# Patient Record
Sex: Male | Born: 1960 | Race: Black or African American | Hispanic: Refuse to answer | Marital: Married | Smoking: Never smoker
Health system: Southern US, Community
[De-identification: ages and names within clinical notes are randomized; demographics above are authoritative.]

## PROBLEM LIST (undated history)

## (undated) DIAGNOSIS — I10 Essential (primary) hypertension: Secondary | ICD-10-CM

## (undated) DIAGNOSIS — C61 Malignant neoplasm of prostate: Secondary | ICD-10-CM

## (undated) DIAGNOSIS — R972 Elevated prostate specific antigen [PSA]: Secondary | ICD-10-CM

## (undated) HISTORY — PX: NO PAST SURGERIES: SHX2092

## (undated) HISTORY — PX: PROSTATE BIOPSY: SHX241

---

## 2005-02-10 ENCOUNTER — Emergency Department (HOSPITAL_COMMUNITY): Admission: EM | Admit: 2005-02-10 | Discharge: 2005-02-10 | Payer: Self-pay | Admitting: Emergency Medicine

## 2010-03-27 ENCOUNTER — Emergency Department (HOSPITAL_COMMUNITY): Admission: EM | Admit: 2010-03-27 | Discharge: 2010-03-27 | Payer: Self-pay | Admitting: Emergency Medicine

## 2010-09-09 LAB — URINALYSIS, ROUTINE W REFLEX MICROSCOPIC
Ketones, ur: NEGATIVE mg/dL
Nitrite: NEGATIVE
Protein, ur: NEGATIVE mg/dL
Specific Gravity, Urine: 1.017 (ref 1.005–1.030)
pH: 7 (ref 5.0–8.0)

## 2010-09-09 LAB — DIFFERENTIAL
Basophils Absolute: 0 10*3/uL (ref 0.0–0.1)
Eosinophils Relative: 1 % (ref 0–5)
Lymphocytes Relative: 44 % (ref 12–46)
Monocytes Relative: 7 % (ref 3–12)
Neutro Abs: 1.2 10*3/uL — ABNORMAL LOW (ref 1.7–7.7)

## 2010-09-09 LAB — CBC
Hemoglobin: 15.4 g/dL (ref 13.0–17.0)
MCHC: 34.5 g/dL (ref 30.0–36.0)
MCV: 85.5 fL (ref 78.0–100.0)
RBC: 5.23 MIL/uL (ref 4.22–5.81)

## 2010-09-09 LAB — BASIC METABOLIC PANEL
Creatinine, Ser: 1.43 mg/dL (ref 0.4–1.5)
GFR calc non Af Amer: 53 mL/min — ABNORMAL LOW (ref >60)
Glucose, Bld: 99 mg/dL (ref 70–99)
Potassium: 4.2 mEq/L (ref 3.5–5.1)
Sodium: 138 mEq/L (ref 135–145)

## 2013-04-02 ENCOUNTER — Emergency Department (HOSPITAL_COMMUNITY)
Admission: EM | Admit: 2013-04-02 | Discharge: 2013-04-02 | Disposition: A | Discharge status: Home / Self Care | Payer: Self-pay | Attending: Emergency Medicine | Admitting: Emergency Medicine

## 2013-04-02 ENCOUNTER — Encounter (HOSPITAL_COMMUNITY): Payer: Self-pay | Admitting: Emergency Medicine

## 2013-04-02 DIAGNOSIS — N189 Chronic kidney disease, unspecified: Secondary | ICD-10-CM | POA: Insufficient documentation

## 2013-04-02 DIAGNOSIS — I129 Hypertensive chronic kidney disease with stage 1 through stage 4 chronic kidney disease, or unspecified chronic kidney disease: Secondary | ICD-10-CM | POA: Insufficient documentation

## 2013-04-02 DIAGNOSIS — I10 Essential (primary) hypertension: Secondary | ICD-10-CM

## 2013-04-02 DIAGNOSIS — R209 Unspecified disturbances of skin sensation: Secondary | ICD-10-CM | POA: Insufficient documentation

## 2013-04-02 DIAGNOSIS — E876 Hypokalemia: Secondary | ICD-10-CM | POA: Insufficient documentation

## 2013-04-02 DIAGNOSIS — R202 Paresthesia of skin: Secondary | ICD-10-CM

## 2013-04-02 DIAGNOSIS — Z79899 Other long term (current) drug therapy: Secondary | ICD-10-CM | POA: Insufficient documentation

## 2013-04-02 HISTORY — DX: Essential (primary) hypertension: I10

## 2013-04-02 LAB — BASIC METABOLIC PANEL
BUN: 27 mg/dL — ABNORMAL HIGH (ref 6–23)
CO2: 32 mEq/L (ref 19–32)
Chloride: 97 mEq/L (ref 96–112)
Sodium: 139 mEq/L (ref 135–145)

## 2013-04-02 MED ORDER — NIFEDIPINE ER 60 MG PO TB24: 60.0000 mg | ORAL_TABLET | Freq: Every day | ORAL | Status: DC

## 2013-04-02 MED ORDER — CHLORTHALIDONE 25 MG PO TABS: 25.0000 mg | ORAL_TABLET | Freq: Every day | ORAL | Status: DC

## 2013-04-02 MED ORDER — METOPROLOL SUCCINATE ER 100 MG PO TB24: 100.0000 mg | ORAL_TABLET | Freq: Every day | ORAL | Status: DC

## 2013-04-02 MED ORDER — POTASSIUM CHLORIDE 20 MEQ/15ML (10%) PO LIQD
40.0000 meq | Freq: Once | ORAL | Status: AC
  Administered 2013-04-02: 40 meq via ORAL
  Filled 2013-04-02: qty 30

## 2013-04-02 MED ORDER — CHLORTHALIDONE 25 MG PO TABS
25.0000 mg | ORAL_TABLET | Freq: Once | ORAL | Status: AC
  Administered 2013-04-02: 25 mg via ORAL
  Filled 2013-04-02: qty 1

## 2013-04-02 NOTE — ED Provider Notes (Signed)
CSN: [id number]     Arrival date & time 04/02/13  0603 History   First MD Initiated Contact with Patient 04/02/13 [phone]     Chief Complaint  Patient presents with  . Hypertension   (Consider location/radiation/quality/duration/timing/severity/associated sxs/prior Treatment) HPI  This is a 52 year old male who presents with hypertension. The patient states "I can just feel my blood pressure was going up."  Patient states he's been out of his blood pressure medications for one day. He was only able to get one refill. He is unsure which one that is but states that it was a small round pill. He did take that medication this morning. He is on metoprolol, chlorthalidone, and nifedipine. Patient denies any headache or chest pain. Patient does state that this morning he noted tingling in his right fifth digit. He denies any weakness or numbness.  Patient denies any other symptoms at this time.  Past Medical History  Diagnosis Date  . Hypertension    History reviewed. No pertinent past surgical history. History reviewed. No pertinent family history. History  Substance Use Topics  . Smoking status: Never Smoker   . Smokeless tobacco: Not on file  . Alcohol Use: Not on file    Review of Systems  Constitutional: Negative.  Negative for fever.  Respiratory: Negative.  Negative for chest tightness and shortness of breath.   Cardiovascular: Negative.  Negative for chest pain.  Gastrointestinal: Negative.  Negative for abdominal pain.  Genitourinary: Negative.  Negative for dysuria.  Musculoskeletal: Negative for back pain.  Neurological: Negative for headaches.       Right fifth digit tingling  All other systems reviewed and are negative.    Allergies  Review of patient's allergies indicates not on file.  Home Medications   Current Outpatient Rx  Name  Route  Sig  Dispense  Refill  . chlorthalidone (HYGROTON) 25 MG tablet   Oral   Take 25 mg by mouth daily.         . metoprolol  succinate (TOPROL-XL) 100 MG 24 hr tablet   Oral   Take 100 mg by mouth at bedtime. Take with or immediately following a meal.         . NIFEdipine (PROCARDIA XL/ADALAT-CC) 60 MG 24 hr tablet   Oral   Take 60 mg by mouth daily at 12 noon. Takes at lunch time.         . chlorthalidone (HYGROTON) 25 MG tablet   Oral   Take 1 tablet (25 mg total) by mouth daily.   30 tablet   0   . metoprolol succinate (TOPROL-XL) 100 MG 24 hr tablet   Oral   Take 1 tablet (100 mg total) by mouth at bedtime.   30 tablet   0   . NIFEdipine (PROCARDIA-XL/ADALAT CC) 60 MG 24 hr tablet   Oral   Take 1 tablet (60 mg total) by mouth daily.   30 tablet   0    BP 157/104  Pulse 71  Temp(Src) 98.2 F (36.8 C) (Oral)  Resp 18  Ht 5\' 11"  (1.803 m)  Wt 217 lb (98.431 kg)  BMI 30.28 kg/m2  SpO2 99% Physical Exam  Nursing note and vitals reviewed. Constitutional: He is oriented to person, place, and time. He appears well-developed and well-nourished. No distress.  HENT:  Head: Normocephalic and atraumatic.  Mouth/Throat: Oropharynx is clear and moist.  Eyes: EOM are normal. Pupils are equal, round, and reactive to light.  Neck: Neck supple.  Cardiovascular:  Normal rate, regular rhythm and normal heart sounds.   No murmur heard. Pulmonary/Chest: Effort normal and breath sounds normal. No respiratory distress. He has no wheezes.  Abdominal: Soft. Bowel sounds are normal. There is no tenderness.  Musculoskeletal: He exhibits no edema.  Lymphadenopathy:    He has no cervical adenopathy.  Neurological: He is alert and oriented to person, place, and time.  Cranial nerves II through XII intact, 5 out of 5 strength in all 4 extremities, no evidence of dysmetria on finger-nose-finger  Skin: Skin is warm and dry.  Psychiatric: He has a normal mood and affect.    ED Course  Procedures (including critical care time) Labs Review Labs Reviewed  BASIC METABOLIC PANEL - Abnormal; Notable for the  following:    Potassium 3.0 (*)    Glucose, Bld 104 (*)    BUN 27 (*)    Creatinine, Ser 1.69 (*)    GFR calc non Af Amer 45 (*)    GFR calc Af Amer 52 (*)    All other components within normal limits   Imaging Review No results found.  EKG independently reviewed by myself: Normal sinus rhythm with a rate of 67, Q waves noted in lead 3 and the anterior leads which is unchanged from prior. New T wave inversion in lead III.  no evidence of acute ST elevation.  MDM   1. Hypertension   2. Paresthesia   3. Chronic kidney disease   4. Hypokalemia    This a 52 year old male who presents with hypertension and paresthesias of the right upper extremity. Patient was noted to be hypertensive on initial evaluation. He denies any chest pain or headache. Screening EKG was obtained it does not show any evidence of acute MI. BMP is notable for a creatinine of 1.69 which is slightly elevated from the patient's baseline of 1.4. He was also noted to be hypokalemic. Potassium was replaced. I researched the pills that he may have taken this morning and I suspect he took his metoprolol given the description of thepill. Patient was given his chlorthalidone.  Regarding the patient's right pinky tingling, his neurologic exam is otherwise within normal limits and I have low suspicion for intracranial abnormality or stroke.  The patient is to followup with his primary care physician for repeat lab work and blood pressure monitoring.  After history, exam, and medical workup I feel the patient has been appropriately medically screened and is safe for discharge home. Pertinent diagnoses were discussed with the patient. Patient was given return precautions.     Shon Baton, MD 04/02/13 [phone]

## 2013-04-02 NOTE — ED Notes (Addendum)
Per pt: I figured it (blood pressure) was going up and then my finger started tingling (right pinky finger).

## 2016-09-06 ENCOUNTER — Emergency Department (HOSPITAL_COMMUNITY): Payer: BLUE CROSS/BLUE SHIELD

## 2016-09-06 ENCOUNTER — Encounter (HOSPITAL_COMMUNITY): Payer: Self-pay | Admitting: Emergency Medicine

## 2016-09-06 ENCOUNTER — Observation Stay (HOSPITAL_COMMUNITY)
Admission: EM | Admit: 2016-09-06 | Discharge: 2016-09-07 | Disposition: A | Discharge status: Home / Self Care | Payer: BLUE CROSS/BLUE SHIELD | Attending: Neurosurgery | Admitting: Neurosurgery

## 2016-09-06 DIAGNOSIS — Z79899 Other long term (current) drug therapy: Secondary | ICD-10-CM | POA: Insufficient documentation

## 2016-09-06 DIAGNOSIS — S066XAA Traumatic subarachnoid hemorrhage with loss of consciousness status unknown, initial encounter: Secondary | ICD-10-CM | POA: Diagnosis present

## 2016-09-06 DIAGNOSIS — S0101XA Laceration without foreign body of scalp, initial encounter: Secondary | ICD-10-CM | POA: Insufficient documentation

## 2016-09-06 DIAGNOSIS — W2211XA Striking against or struck by driver side automobile airbag, initial encounter: Secondary | ICD-10-CM | POA: Diagnosis not present

## 2016-09-06 DIAGNOSIS — Z23 Encounter for immunization: Secondary | ICD-10-CM | POA: Insufficient documentation

## 2016-09-06 DIAGNOSIS — I10 Essential (primary) hypertension: Secondary | ICD-10-CM | POA: Insufficient documentation

## 2016-09-06 DIAGNOSIS — S066X0A Traumatic subarachnoid hemorrhage without loss of consciousness, initial encounter: Principal | ICD-10-CM | POA: Insufficient documentation

## 2016-09-06 DIAGNOSIS — S066X9A Traumatic subarachnoid hemorrhage with loss of consciousness of unspecified duration, initial encounter: Secondary | ICD-10-CM

## 2016-09-06 DIAGNOSIS — Y92410 Unspecified street and highway as the place of occurrence of the external cause: Secondary | ICD-10-CM | POA: Insufficient documentation

## 2016-09-06 LAB — CBC
HEMATOCRIT: 46.2 % (ref 39.0–52.0)
HEMOGLOBIN: 15.7 g/dL (ref 13.0–17.0)
MCH: 29.2 pg (ref 26.0–34.0)
MCHC: 34 g/dL (ref 30.0–36.0)
MCV: 85.9 fL (ref 78.0–100.0)
Platelets: 147 10*3/uL — ABNORMAL LOW (ref 150–400)
RBC: 5.38 MIL/uL (ref 4.22–5.81)
RDW: 13.4 % (ref 11.5–15.5)
WBC: 11.2 10*3/uL — ABNORMAL HIGH (ref 4.0–10.5)

## 2016-09-06 LAB — COMPREHENSIVE METABOLIC PANEL
ALT: 28 U/L (ref 17–63)
ANION GAP: 11 (ref 5–15)
AST: 44 U/L — ABNORMAL HIGH (ref 15–41)
Albumin: 4.3 g/dL (ref 3.5–5.0)
Alkaline Phosphatase: 67 U/L (ref 38–126)
BUN: 19 mg/dL (ref 6–20)
CHLORIDE: 101 mmol/L (ref 101–111)
CO2: 27 mmol/L (ref 22–32)
Calcium: 8.9 mg/dL (ref 8.9–10.3)
Creatinine, Ser: 1.43 mg/dL — ABNORMAL HIGH (ref 0.61–1.24)
GFR calc non Af Amer: 53 mL/min — ABNORMAL LOW (ref >60)
Glucose, Bld: 190 mg/dL — ABNORMAL HIGH (ref 65–99)
Potassium: 3.2 mmol/L — ABNORMAL LOW (ref 3.5–5.1)
SODIUM: 139 mmol/L (ref 135–145)
Total Bilirubin: 0.9 mg/dL (ref 0.3–1.2)
Total Protein: 7 g/dL (ref 6.5–8.1)

## 2016-09-06 LAB — PROTIME-INR
INR: 0.97
Prothrombin Time: 12.9 seconds (ref 11.4–15.2)

## 2016-09-06 LAB — ETHANOL: Alcohol, Ethyl (B): <5 mg/dL (ref <5)

## 2016-09-06 LAB — HIV ANTIBODY (ROUTINE TESTING W REFLEX): HIV SCREEN 4TH GENERATION: NONREACTIVE

## 2016-09-06 LAB — CDS SEROLOGY

## 2016-09-06 MED ORDER — NIFEDIPINE ER 60 MG PO TB24
60.0000 mg | ORAL_TABLET | Freq: Every day | ORAL | Status: DC
  Administered 2016-09-06 – 2016-09-07 (×2): 60 mg via ORAL
  Filled 2016-09-06 (×2): qty 1

## 2016-09-06 MED ORDER — SODIUM CHLORIDE 0.9 % IV SOLN
INTRAVENOUS | Status: DC
  Administered 2016-09-06: 11:00:00 via INTRAVENOUS
  Filled 2016-09-06 (×2): qty 1000

## 2016-09-06 MED ORDER — HYDROCODONE-ACETAMINOPHEN 5-325 MG PO TABS
1.0000 | ORAL_TABLET | Freq: Once | ORAL | Status: AC
  Administered 2016-09-06: 1 via ORAL
  Filled 2016-09-06: qty 1

## 2016-09-06 MED ORDER — LABETALOL HCL 5 MG/ML IV SOLN
10.0000 mg | Freq: Once | INTRAVENOUS | Status: AC
  Administered 2016-09-06: 10 mg via INTRAVENOUS
  Filled 2016-09-06: qty 4

## 2016-09-06 MED ORDER — METOPROLOL SUCCINATE ER 100 MG PO TB24
100.0000 mg | ORAL_TABLET | Freq: Every day | ORAL | Status: DC
  Administered 2016-09-06: 100 mg via ORAL
  Filled 2016-09-06: qty 1

## 2016-09-06 MED ORDER — CHLORTHALIDONE 25 MG PO TABS
25.0000 mg | ORAL_TABLET | Freq: Every day | ORAL | Status: DC
  Administered 2016-09-06 – 2016-09-07 (×2): 25 mg via ORAL
  Filled 2016-09-06 (×2): qty 1

## 2016-09-06 MED ORDER — HYDROCODONE-ACETAMINOPHEN 5-325 MG PO TABS: 1.0000 | ORAL_TABLET | ORAL | Status: DC | PRN

## 2016-09-06 MED ORDER — ONDANSETRON 4 MG PO TBDP
8.0000 mg | ORAL_TABLET | Freq: Once | ORAL | Status: AC
  Administered 2016-09-06: 8 mg via ORAL
  Filled 2016-09-06: qty 2

## 2016-09-06 MED ORDER — ONDANSETRON HCL 4 MG PO TABS: 4.0000 mg | ORAL_TABLET | Freq: Four times a day (QID) | ORAL | Status: DC | PRN

## 2016-09-06 MED ORDER — HYDROCODONE-ACETAMINOPHEN 5-325 MG PO TABS
2.0000 | ORAL_TABLET | ORAL | Status: DC | PRN
  Administered 2016-09-06: 2 via ORAL
  Filled 2016-09-06 (×2): qty 2

## 2016-09-06 MED ORDER — TETANUS-DIPHTH-ACELL PERTUSSIS 5-2.5-18.5 LF-MCG/0.5 IM SUSP
0.5000 mL | Freq: Once | INTRAMUSCULAR | Status: AC
  Administered 2016-09-06: 0.5 mL via INTRAMUSCULAR
  Filled 2016-09-06: qty 0.5

## 2016-09-06 MED ORDER — ONDANSETRON HCL 4 MG/2ML IJ SOLN
4.0000 mg | Freq: Four times a day (QID) | INTRAMUSCULAR | Status: DC | PRN
  Filled 2016-09-06: qty 2

## 2016-09-06 MED ORDER — ACETAMINOPHEN 325 MG PO TABS: 650.0000 mg | ORAL_TABLET | ORAL | Status: DC | PRN
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
## 2016-09-06 NOTE — ED Notes (Signed)
Dr. Wickline at bedside.  
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
## 2016-09-06 NOTE — Care Management Note (Signed)
Case Management Note  Patient Details  Name: Bryce Sullivan MRN: [id number] Date of Birth: 02-Mar-1961  Subjective/Objective:      [facility]  Work-up underway. CM will follow for discharge needs pending patient's progress and physician orders.               Action/Plan:   Expected Discharge Date:                  Expected Discharge Plan:     In-House Referral:     Discharge planning Services     Post Acute Care Choice:    Choice offered to:     DME Arranged:    DME Agency:     HH Arranged:    HH Agency:     Status of Service:     If discussed at [employer] Length of Stay Meetings, dates discussed:    Additional Comments:  Anda KraftRobarge,  C, RN 09/06/2016, 10:24 AM
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
## 2016-09-07 ENCOUNTER — Encounter (HOSPITAL_COMMUNITY): Payer: Self-pay

## 2016-09-07 ENCOUNTER — Observation Stay (HOSPITAL_COMMUNITY): Payer: BLUE CROSS/BLUE SHIELD

## 2016-09-07 DIAGNOSIS — S066X0A Traumatic subarachnoid hemorrhage without loss of consciousness, initial encounter: Secondary | ICD-10-CM | POA: Diagnosis not present
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
## 2016-09-07 NOTE — Progress Notes (Signed)
D/C instructions provided to patient, denies questions/concerns at this time. Patient taken to front entrance via W/C by volunteer services.
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
## 2016-09-07 NOTE — Care Management Note (Signed)
Case Management Note  Patient Details  Name: Bryce Sullivan MRN: [id number] Date of Birth: 27-Oct-1960  Subjective/Objective:                    Action/Plan: Pt discharging home with self care. Pt with insurance and transportation home. Pt with hospital f/u. No further needs per CM.  Expected Discharge Date:  09/07/16               Expected Discharge Plan:  Home/Self Care  In-House Referral:     Discharge planning Services     Post Acute Care Choice:    Choice offered to:     DME Arranged:    DME Agency:     HH Arranged:    HH Agency:     Status of Service:  Completed, signed off  If discussed at [employer] Length of Stay Meetings, dates discussed:    Additional Comments:  Kermit BaloKelli F , RN 09/07/2016, 10:49 AM
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
## 2020-02-26 ENCOUNTER — Encounter (HOSPITAL_COMMUNITY): Payer: Self-pay | Admitting: Emergency Medicine

## 2020-02-26 ENCOUNTER — Emergency Department (HOSPITAL_COMMUNITY)
Admission: EM | Admit: 2020-02-26 | Discharge: 2020-02-26 | Disposition: A | Discharge status: Home / Self Care | Payer: BC Managed Care – PPO | Attending: Emergency Medicine | Admitting: Emergency Medicine

## 2020-02-26 ENCOUNTER — Other Ambulatory Visit: Payer: Self-pay

## 2020-02-26 DIAGNOSIS — I10 Essential (primary) hypertension: Secondary | ICD-10-CM | POA: Diagnosis present

## 2020-02-26 DIAGNOSIS — Z79899 Other long term (current) drug therapy: Secondary | ICD-10-CM | POA: Insufficient documentation

## 2020-02-26 MED ORDER — HYDROCHLOROTHIAZIDE 12.5 MG PO CAPS
12.5000 mg | ORAL_CAPSULE | Freq: Once | ORAL | Status: AC
  Administered 2020-02-26: 09:00:00 12.5 mg via ORAL
  Filled 2020-02-26: qty 1

## 2020-02-26 MED ORDER — AMLODIPINE BESYLATE 10 MG PO TABS: 10.0000 mg | ORAL_TABLET | Freq: Every day | ORAL | 0 refills | Status: DC

## 2020-02-26 MED ORDER — HYDROCHLOROTHIAZIDE 12.5 MG PO TABS: 12.5000 mg | ORAL_TABLET | Freq: Every day | ORAL | 0 refills | Status: DC

## 2020-02-26 MED ORDER — AMLODIPINE BESYLATE 5 MG PO TABS
ORAL_TABLET | ORAL | Status: AC
  Filled 2020-02-26: qty 1

## 2020-02-26 MED ORDER — AMLODIPINE BESYLATE 5 MG PO TABS
10.0000 mg | ORAL_TABLET | Freq: Once | ORAL | Status: AC
Start: 2020-02-26 — End: 2020-02-26
  Administered 2020-02-26: 09:00:00 10 mg via ORAL
  Filled 2020-02-26: qty 2

## 2020-02-26 NOTE — ED Notes (Signed)
NORVASC 10mg  is 2 tablets.  This RN accidentally dropped one of the tablets in the trash.  A replacement tablet was pulled and given.

## 2020-02-26 NOTE — ED Triage Notes (Signed)
Pt reports that he thought his doctor was out for money so quit seeing him over year ago-so hasnt had HTN meds that long. Reports that today woke up with nose bleed and BP was 200s/100s. Nose bleed has stopped. Pt BP today here 242/144.

## 2020-02-26 NOTE — ED Notes (Signed)
Verbalized understanding discharge instructions, prescriptions, and follow-up. In no acute distress.   

## 2020-02-26 NOTE — ED Provider Notes (Signed)
[facility]   CSN: [id number] Arrival date & time: 02/26/20  1540     History Chief Complaint  Patient presents with  . Hypertension    Bryce Sullivan is a 59 y.o. male.  The history is provided by the patient.  Hypertension This is a chronic problem. The current episode started more than 1 week ago. The problem occurs daily. The problem has not changed since onset.Pertinent negatives include no chest pain, no abdominal pain, no headaches and no shortness of breath. Nothing aggravates the symptoms. Nothing relieves the symptoms. He has tried nothing for the symptoms. The treatment provided no relief.       Past Medical History:  Diagnosis Date  . Hypertension     Patient Active Problem List   Diagnosis Date Noted  . Traumatic subarachnoid hemorrhage (HCC) 09/06/2016    History reviewed. No pertinent surgical history.     No family history on file.  Social History   Tobacco Use  . Smoking status: Never Smoker  . Smokeless tobacco: Never Used  Substance Use Topics  . Alcohol use: Not on file  . Drug use: No    Home Medications Prior to Admission medications   Medication Sig Start Date End Date Taking? Authorizing Provider  amLODipine (NORVASC) 10 MG tablet Take 1 tablet (10 mg total) by mouth daily. 02/26/20 03/27/20  , , DO  hydrochlorothiazide (HYDRODIURIL) 12.5 MG tablet Take 1 tablet (12.5 mg total) by mouth daily. 02/26/20 03/27/20  , , DO  chlorthalidone (HYGROTON) 25 MG tablet Take 1 tablet (25 mg total) by mouth daily. 04/02/13 02/26/20  Horton, Mayer Masker, MD  metoprolol succinate (TOPROL-XL) 100 MG 24 hr tablet Take 1 tablet (100 mg total) by mouth at bedtime. 04/02/13 02/26/20  Horton, Mayer Masker, MD  NIFEdipine (PROCARDIA-XL/ADALAT CC) 60 MG 24 hr tablet Take 1 tablet (60 mg total) by mouth daily. 04/02/13 02/26/20  Horton, Mayer Masker, MD    Allergies    Patient has no known allergies.  Review  of Systems   Review of Systems  Constitutional: Negative for chills and fever.  HENT: Negative for ear pain and sore throat.   Eyes: Negative for pain and visual disturbance.  Respiratory: Negative for cough and shortness of breath.   Cardiovascular: Negative for chest pain and palpitations.  Gastrointestinal: Negative for abdominal pain and vomiting.  Genitourinary: Negative for dysuria and hematuria.  Musculoskeletal: Negative for arthralgias and back pain.  Skin: Negative for color change and rash.  Neurological: Negative for seizures, syncope and headaches.  All other systems reviewed and are negative.   Physical Exam Updated Vital Signs BP (!) 209/149   Pulse 87   Temp 98.4 F (36.9 C) (Oral)   Resp 17   SpO2 98%   Physical Exam Vitals and nursing note reviewed.  Constitutional:      General: He is not in acute distress.    Appearance: He is well-developed. He is not ill-appearing.  HENT:     Head: Normocephalic and atraumatic.     Nose: Nose normal.     Mouth/Throat:     Mouth: Mucous membranes are moist.  Eyes:     Extraocular Movements: Extraocular movements intact.     Conjunctiva/sclera: Conjunctivae normal.     Pupils: Pupils are equal, round, and reactive to light.  Cardiovascular:     Rate and Rhythm: Normal rate and regular rhythm.     Pulses: Normal pulses.     Heart sounds:  Normal heart sounds. No murmur heard.   Pulmonary:     Effort: Pulmonary effort is normal. No respiratory distress.     Breath sounds: Normal breath sounds.  Abdominal:     Palpations: Abdomen is soft.     Tenderness: There is no abdominal tenderness.  Musculoskeletal:        General: Normal range of motion.     Cervical back: Normal range of motion and neck supple.  Skin:    General: Skin is warm and dry.     Capillary Refill: Capillary refill takes less than 2 seconds.  Neurological:     General: No focal deficit present.     Mental Status: He is alert and oriented to  person, place, and time.     Cranial Nerves: No cranial nerve deficit.     Sensory: No sensory deficit.     Motor: No weakness.     Coordination: Coordination normal.  Psychiatric:        Mood and Affect: Mood normal.     ED Results / Procedures / Treatments   Labs (all labs ordered are listed, but only abnormal results are displayed) Labs Reviewed - No data to display  EKG None  Radiology No results found.  Procedures Procedures (including critical care time)  Medications Ordered in ED Medications  amLODipine (NORVASC) 5 MG tablet (has no administration in time range)  amLODipine (NORVASC) tablet 10 mg (10 mg Oral Given 02/26/20 0922)  hydrochlorothiazide (MICROZIDE) capsule 12.5 mg (12.5 mg Oral Given 02/26/20 4967)    ED Course  I have reviewed the triage vital signs and the nursing notes.  Pertinent labs & imaging results that were available during my care of the patient were reviewed by me and considered in my medical decision making (see chart for details).    MDM Rules/Calculators/A&P                          Chanc Kervin is a 59 year old male with history of hypertension who presents the ED with hypertension.  Blood pressure initially in the 240s over 140 systolic on repeat 209/140.  Patient has not taken blood pressure medication in over a year.  He is asymptomatic.  Does not follow with primary care anymore.  Was not happy with his care as blood pressure was always in the 200s.  Denies any chest pain.  Normal neurological exam.  Overall he is asymptomatic.  On chart review he does have CKD at baseline.  Did offer him lab work today but he declined.  We will start him on amlodipine and hydrochlorothiazide.  He states that he has a new doctor to follow-up with in [place].  He understands return precautions and was discharged in ED in good condition.  This chart was dictated using voice recognition software.  Despite best efforts to proofread,  errors can occur which can  change the documentation meaning.    Final Clinical Impression(s) / ED Diagnoses Final diagnoses:  Hypertension, unspecified type    Rx / DC Orders ED Discharge Orders         Ordered    amLODipine (NORVASC) 10 MG tablet  Daily        02/26/20 0925    hydrochlorothiazide (HYDRODIURIL) 12.5 MG tablet  Daily        02/26/20 0925           Virgina Norfolk, DO 02/26/20 [phone]

## 2020-02-27 ENCOUNTER — Ambulatory Visit: Payer: BC Managed Care – PPO

## 2020-02-27 ENCOUNTER — Ambulatory Visit: Payer: BC Managed Care – PPO | Attending: Internal Medicine | Admitting: Internal Medicine

## 2020-02-27 ENCOUNTER — Encounter: Payer: Self-pay | Admitting: Internal Medicine

## 2020-02-27 DIAGNOSIS — I10 Essential (primary) hypertension: Secondary | ICD-10-CM | POA: Insufficient documentation

## 2020-02-27 DIAGNOSIS — R040 Epistaxis: Secondary | ICD-10-CM | POA: Insufficient documentation

## 2020-02-27 MED ORDER — AMLODIPINE BESYLATE 10 MG PO TABS: 10.0000 mg | ORAL_TABLET | Freq: Every day | ORAL | 2 refills | Status: DC

## 2020-02-27 MED ORDER — HYDROCHLOROTHIAZIDE 12.5 MG PO TABS: 12.5000 mg | ORAL_TABLET | Freq: Every day | ORAL | 2 refills | Status: DC

## 2020-02-27 MED ORDER — CARVEDILOL 3.125 MG PO TABS: 3.1250 mg | ORAL_TABLET | Freq: Two times a day (BID) | ORAL | 2 refills | Status: DC

## 2020-02-27 NOTE — Progress Notes (Signed)
Virtual Visit via Telephone Note Due to current restrictions/limitations of in-office visits due to the COVID-19 pandemic, this scheduled clinical appointment was converted to a telehealth visit  I connected with Bryce Sullivan on 02/27/20 at  9:50 AM EDT by telephone and verified that I am speaking with the correct person using two identifiers.  I am in my office.  The patient is at home.  Only the patient, his fiance and myself participated in this encounter.  I discussed the limitations, risks, security and privacy concerns of performing an evaluation and management service by telephone and the availability of in person appointments. I also discussed with the patient that there may be a patient responsible charge related to this service. The patient expressed understanding and agreed to proceed.   History of Present Illness: Pt with hx of HTN.  Pt est care and for ER f/u.  Had PCP in [facility] but he stopped going because his BP stayed elevated despite being on med.  Last seen there 6-7 mths ago.  He was Chlorthalidone.  Pt went to ER yesterday due to RT sided epistasis. He felt this was due to BP being elevated.  BP on arrival was 240/140.  Patient started on amlodipine and hydrochlorothiazide. -BP this a.m 196/119 at home -he limits salt in foods.  No CP/SOB/LE edema.  No HA. No further epistaxis but had 3 episodes this yr always from RT nostril.  Does not pick nose.    Past medical, surgical, family history and social history reviewed and updated in the system.  Outpatient Encounter Medications as of 02/27/2020  Medication Sig  . amLODipine (NORVASC) 10 MG tablet Take 1 tablet (10 mg total) by mouth daily.  . carvedilol (COREG) 3.125 MG tablet Take 1 tablet (3.125 mg total) by mouth 2 (two) times daily with a meal.  . hydrochlorothiazide (HYDRODIURIL) 12.5 MG tablet Take 1 tablet (12.5 mg total) by mouth daily.  . [DISCONTINUED] amLODipine (NORVASC) 10 MG tablet Take 1 tablet (10 mg total)  by mouth daily.  . [DISCONTINUED] chlorthalidone (HYGROTON) 25 MG tablet Take 1 tablet (25 mg total) by mouth daily.  . [DISCONTINUED] hydrochlorothiazide (HYDRODIURIL) 12.5 MG tablet Take 1 tablet (12.5 mg total) by mouth daily.  . [DISCONTINUED] metoprolol succinate (TOPROL-XL) 100 MG 24 hr tablet Take 1 tablet (100 mg total) by mouth at bedtime.  . [DISCONTINUED] NIFEdipine (PROCARDIA-XL/ADALAT CC) 60 MG 24 hr tablet Take 1 tablet (60 mg total) by mouth daily.   No facility-administered encounter medications on file as of 02/27/2020.   Social History   Socioeconomic History  . Marital status: Significant Other    Spouse name: Not on file  . Number of children: 3  . Years of education: Not on file  . Highest education level: 12th grade  Occupational History  . Not on file  Tobacco Use  . Smoking status: Never Smoker  . Smokeless tobacco: Never Used  Vaping Use  . Vaping Use: Never used  Substance and Sexual Activity  . Alcohol use: Not on file    Comment: occasionally  . Drug use: No  . Sexual activity: Not on file  Other Topics Concern  . Not on file  Social History Narrative  . Not on file   Social Determinants of Health   Financial Resource Strain:   . Difficulty of Paying Living Expenses: Not on file  Food Insecurity:   . Worried About Programme researcher, broadcasting/film/video in the Last Year: Not on file  . Ran  Out of Food in the Last Year: Not on file  Transportation Needs:   . Lack of Transportation (Medical): Not on file  . Lack of Transportation (Non-Medical): Not on file  Physical Activity:   . Days of Exercise per Week: Not on file  . Minutes of Exercise per Session: Not on file  Stress:   . Feeling of Stress : Not on file  Social Connections:   . Frequency of Communication with Friends and Family: Not on file  . Frequency of Social Gatherings with Friends and Family: Not on file  . Attends Religious Services: Not on file  . Active Member of Clubs or Organizations: Not on  file  . Attends Banker Meetings: Not on file  . Marital Status: Not on file   Family History  Problem Relation Age of Onset  . Hypertension Mother   . Hypertension Father   . Hypertension Sister   . Diabetes Sister   . Hypertension Brother   . Diabetes Brother      Observations/Objective: No direct observation as this was a televisit  Assessment and Plan: 1. Essential hypertension Patient with significant hypertension.  Improved from yesterday when he was seen in the emergency room but not at goal. Went over blood pressure goal of 130/80 or lower.  Continue Norvasc and hydrochlorothiazide.  I opted not to increase the hydrochlorothiazide until I see what his kidney function looks like and his potassium level. -Add carvedilol. DASH diet discussed and encouraged. Follow-up with clinical pharmacist in 1 week for repeat blood pressure check.  Advised patient to check blood pressure daily and record his numbers.  Bring in his recordings when he comes to see the clinical pharmacist.  I will see him in the office in person in 1 month. - CBC; Future - Comprehensive metabolic panel; Future - Lipid panel; Future - amLODipine (NORVASC) 10 MG tablet; Take 1 tablet (10 mg total) by mouth daily.  Dispense: 30 tablet; Refill: 2 - hydrochlorothiazide (HYDRODIURIL) 12.5 MG tablet; Take 1 tablet (12.5 mg total) by mouth daily.  Dispense: 30 tablet; Refill: 2 - carvedilol (COREG) 3.125 MG tablet; Take 1 tablet (3.125 mg total) by mouth 2 (two) times daily with a meal.  Dispense: 60 tablet; Refill: 2  2. Epistaxis - Ambulatory referral to ENT   Follow Up Instructions: 1 mth with me With clinical pharmacist in 1 wk   I discussed the assessment and treatment plan with the patient. The patient was provided an opportunity to ask questions and all were answered. The patient agreed with the plan and demonstrated an understanding of the instructions.   The patient was advised to call back  or seek an in-person evaluation if the symptoms worsen or if the condition fails to improve as anticipated.  I provided 20 minutes of non-face-to-face time during this encounter.   Jonah Blue, MD

## 2020-02-28 ENCOUNTER — Telehealth: Payer: Self-pay | Admitting: Internal Medicine

## 2020-02-28 LAB — COMPREHENSIVE METABOLIC PANEL
ALT: 16 IU/L (ref 0–44)
AST: 21 IU/L (ref 0–40)
Albumin/Globulin Ratio: 1.4 (ref 1.2–2.2)
Albumin: 4.1 g/dL (ref 3.8–4.9)
Alkaline Phosphatase: 102 IU/L (ref 48–121)
BUN/Creatinine Ratio: 9 (ref 9–20)
BUN: 19 mg/dL (ref 6–24)
Bilirubin Total: 0.6 mg/dL (ref 0.0–1.2)
CO2: 27 mmol/L (ref 20–29)
Calcium: 9.3 mg/dL (ref 8.7–10.2)
Chloride: 99 mmol/L (ref 96–106)
Creatinine, Ser: 2.14 mg/dL — ABNORMAL HIGH (ref 0.76–1.27)
GFR calc Af Amer: 38 mL/min/{1.73_m2} — ABNORMAL LOW (ref >59)
GFR calc non Af Amer: 33 mL/min/{1.73_m2} — ABNORMAL LOW (ref >59)
Globulin, Total: 2.9 g/dL (ref 1.5–4.5)
Glucose: 203 mg/dL — ABNORMAL HIGH (ref 65–99)
Potassium: 3.6 mmol/L (ref 3.5–5.2)
Sodium: 140 mmol/L (ref 134–144)
Total Protein: 7 g/dL (ref 6.0–8.5)

## 2020-02-28 LAB — CBC
Hematocrit: 47.7 % (ref 37.5–51.0)
Hemoglobin: 15.6 g/dL (ref 13.0–17.7)
MCH: 28 pg (ref 26.6–33.0)
MCHC: 32.7 g/dL (ref 31.5–35.7)
MCV: 86 fL (ref 79–97)
Platelets: 235 10*3/uL (ref 150–450)
RBC: 5.57 x10E6/uL (ref 4.14–5.80)
RDW: 12.6 % (ref 11.6–15.4)
WBC: 5.5 10*3/uL (ref 3.4–10.8)

## 2020-02-28 LAB — LIPID PANEL
Chol/HDL Ratio: 2.9 ratio (ref 0.0–5.0)
Cholesterol, Total: 184 mg/dL (ref 100–199)
HDL: 64 mg/dL (ref >39)
LDL Chol Calc (NIH): 107 mg/dL — ABNORMAL HIGH (ref 0–99)
Triglycerides: 67 mg/dL (ref 0–149)
VLDL Cholesterol Cal: 13 mg/dL (ref 5–40)

## 2020-02-28 NOTE — Telephone Encounter (Signed)
GoalPhone call placed to patient today to discuss lab results. I left a message on his voicemail informing him of who I am and that I was calling to his lab results with him. I will try to reach him again next week.

## 2020-03-02 ENCOUNTER — Telehealth: Payer: Self-pay | Admitting: Internal Medicine

## 2020-03-02 NOTE — Progress Notes (Signed)
Let pt know that his LDL cholesterol is 107 with goal being less than 70.  Healthy eating habits and regular exercise will help to lower cholesterol. Blood sugar elevated and suggest that he likely has DM. Needs in-person visit to discuss further.  Kidney function not 100% and definitely worse than 3 years ago.  Liver function tests normal.  Blood count normal meaning no anemia.  Please give pt an in-person f/u appt with me with in the next 2-3 weeks.

## 2020-03-02 NOTE — Progress Notes (Signed)
Let pt know that his LDL cholesterol is 107 with goal being less than 70.  Healthy eating habits and regular exercise will help to lower cholesterol. Blood sugar elevated and suggest that he likely has DM. Needs in-person visit to discuss further.  Kidney function not 100% and definitely worse than 3 years ago.  Do not take any OTC pain meds like Ibuprofen, Aleve, Advil, Naprosyn and Motrin as these can make kidney function worse.  Liver function tests normal.  Blood count normal meaning no anemia.  Please give pt an in-person f/u appt with me with in the next 2-3 weeks.

## 2020-03-02 NOTE — Telephone Encounter (Signed)
Phone call placed to pt today to discuss lab results.  Left message on VM informing him of who I am and my reason for calling.  Request that he call us tomorrow and request to speak with my medical assistant to get lab results.

## 2020-03-03 ENCOUNTER — Telehealth: Payer: Self-pay | Admitting: General Practice

## 2020-03-03 NOTE — Telephone Encounter (Signed)
Please advise.   Copied from CRM [id number]. Topic: Quick Communication - Lab Results (Clinic Use ONLY) >> Mar 03, 2020  9:59 AM Crist Infante wrote: Pt returning Dr Laural Benes call about his lab results. Unable to reach office.  Please cal pt back

## 2020-03-03 NOTE — Telephone Encounter (Signed)
Returned pt call and went over lab results. Pt is schedule to see Dr. Laural Benes on 9/21

## 2020-03-17 ENCOUNTER — Encounter: Payer: Self-pay | Admitting: Internal Medicine

## 2020-03-17 ENCOUNTER — Other Ambulatory Visit: Payer: Self-pay

## 2020-03-17 ENCOUNTER — Ambulatory Visit: Payer: BC Managed Care – PPO | Attending: Internal Medicine | Admitting: Internal Medicine

## 2020-03-17 VITALS — BP 166/114 | HR 78 | Temp 98.2°F | Resp 16 | Ht 71.0 in | Wt 193.2 lb

## 2020-03-17 DIAGNOSIS — N1832 Chronic kidney disease, stage 3b: Secondary | ICD-10-CM

## 2020-03-17 DIAGNOSIS — I10 Essential (primary) hypertension: Secondary | ICD-10-CM

## 2020-03-17 DIAGNOSIS — R739 Hyperglycemia, unspecified: Secondary | ICD-10-CM | POA: Diagnosis not present

## 2020-03-17 DIAGNOSIS — E7800 Pure hypercholesterolemia, unspecified: Secondary | ICD-10-CM | POA: Diagnosis not present

## 2020-03-17 DIAGNOSIS — Z2821 Immunization not carried out because of patient refusal: Secondary | ICD-10-CM | POA: Insufficient documentation

## 2020-03-17 LAB — POCT GLYCOSYLATED HEMOGLOBIN (HGB A1C): HbA1c POC (<> result, manual entry): 5.6 % (ref 4.0–5.6)

## 2020-03-17 LAB — GLUCOSE, POCT (MANUAL RESULT ENTRY): POC Glucose: 111 mg/dl — AB (ref 70–99)

## 2020-03-17 MED ORDER — CLONIDINE HCL 0.1 MG PO TABS
0.1000 mg | ORAL_TABLET | Freq: Once | ORAL | Status: AC
  Administered 2020-03-17: 0.1 mg via ORAL

## 2020-03-17 MED ORDER — CARVEDILOL 6.25 MG PO TABS: 6.2500 mg | ORAL_TABLET | Freq: Two times a day (BID) | ORAL | 6 refills | Status: DC

## 2020-03-17 NOTE — Progress Notes (Signed)
Patient ID: Bryce Sullivan, male    DOB: September 09, 1960  MRN: [id number]  CC: Hypertension   Subjective: Bryce Sullivan is a 59 y.o. male who presents for chronic ds management and follow-up on lab test. His concerns today include:  Pt with hx of HTN, epistaxis, HL, CKD 3  My first appointment with him was earlier this month as a telephone visit.  We addressed his blood pressure and epistaxis.  HYPERTENSION Currently taking: see medication list.  He is on Norvasc, hydrochlorothiazide and we added carvedilol when I spoke with him earlier this month.  He reports compliance with medications.  Took meds already this a.m  Med Adherence: [x]  Yes    []  No Medication side effects: []  Yes    [x]  No Adherence with salt restriction: [x]  Yes    []  No Home Monitoring?: [x]  Yes    []  No Monitoring Frequency: twice a day Home BP results range: 160-180/90-104 (one reading of 116) SOB? []  Yes    [x]  No Chest Pain?: []  Yes    [x]  No Leg swelling?: []  Yes    [x]  No Headaches?: []  Yes    [x]  No Dizziness? []  Yes    [x]  No Comments:   I went over lab results eith him.  His kidney function is not 100%.  GFR was 38.  Three yrs ago, creat was 1.43 and GFR was 60.   Still makes good urine  BS was 203 on lab test. fhx of DM in siblings. Denies wgh changes, blurred vision, polyuria/polydipsia, no numbness in hands/feet.   Epistasis: He has not had any further epistaxis since our telephone visit 3 weeks ago.  He has not received a call as yet for the appointment with the ear nose and throat specialist  HM:  Declines flu shot.  Has not made up mind to get COVID vaccine Patient Active Problem List   Diagnosis Date Noted  . Essential hypertension 02/27/2020  . Epistaxis 02/27/2020  . Traumatic subarachnoid hemorrhage (HCC) 09/06/2016     Current Outpatient Medications on File Prior to Visit  Medication Sig Dispense Refill  . amLODipine (NORVASC) 10 MG tablet Take 1 tablet (10 mg total) by mouth daily. 30  tablet 2  . carvedilol (COREG) 3.125 MG tablet Take 1 tablet (3.125 mg total) by mouth 2 (two) times daily with a meal. 60 tablet 2  . hydrochlorothiazide (HYDRODIURIL) 12.5 MG tablet Take 1 tablet (12.5 mg total) by mouth daily. 30 tablet 2  . [DISCONTINUED] chlorthalidone (HYGROTON) 25 MG tablet Take 1 tablet (25 mg total) by mouth daily. 30 tablet 0  . [DISCONTINUED] metoprolol succinate (TOPROL-XL) 100 MG 24 hr tablet Take 1 tablet (100 mg total) by mouth at bedtime. 30 tablet 0  . [DISCONTINUED] NIFEdipine (PROCARDIA-XL/ADALAT CC) 60 MG 24 hr tablet Take 1 tablet (60 mg total) by mouth daily. 30 tablet 0   No current facility-administered medications on file prior to visit.    No Known Allergies  Social History   Socioeconomic History  . Marital status: Significant Other    Spouse name: Not on file  . Number of children: 3  . Years of education: Not on file  . Highest education level: 12th grade  Occupational History  . Not on file  Tobacco Use  . Smoking status: Never Smoker  . Smokeless tobacco: Never Used  Vaping Use  . Vaping Use: Never used  Substance and Sexual Activity  . Alcohol use: Not on file    Comment:  occasionally  . Drug use: No  . Sexual activity: Not on file  Other Topics Concern  . Not on file  Social History Narrative  . Not on file   Social Determinants of Health   Financial Resource Strain:   . Difficulty of Paying Living Expenses: Not on file  Food Insecurity:   . Worried About Programme researcher, broadcasting/film/video in the Last Year: Not on file  . Ran Out of Food in the Last Year: Not on file  Transportation Needs:   . Lack of Transportation (Medical): Not on file  . Lack of Transportation (Non-Medical): Not on file  Physical Activity:   . Days of Exercise per Week: Not on file  . Minutes of Exercise per Session: Not on file  Stress:   . Feeling of Stress : Not on file  Social Connections:   . Frequency of Communication with Friends and Family: Not on  file  . Frequency of Social Gatherings with Friends and Family: Not on file  . Attends Religious Services: Not on file  . Active Member of Clubs or Organizations: Not on file  . Attends Banker Meetings: Not on file  . Marital Status: Not on file  Intimate Partner Violence:   . Fear of Current or Ex-Partner: Not on file  . Emotionally Abused: Not on file  . Physically Abused: Not on file  . Sexually Abused: Not on file    Family History  Problem Relation Age of Onset  . Hypertension Mother   . Hypertension Father   . Hypertension Sister   . Diabetes Sister   . Hypertension Brother   . Diabetes Brother     Past Surgical History:  Procedure Laterality Date  . NO PAST SURGERIES      ROS: Review of Systems Negative except as stated above  PHYSICAL EXAM: BP (!) 183/117 (BP Location: Right Arm, Patient Position: Sitting, Cuff Size: Normal)   Pulse 78   Temp 98.2 F (36.8 C)   Resp 16   Ht 5\' 11"  (1.803 m)   Wt 193 lb 3.2 oz (87.6 kg)   SpO2 99%   BMI 26.95 kg/m   Physical Exam General appearance - alert, well appearing, older African-American male and in no distress Mental status - normal mood, behavior, speech, dress, motor activity, and thought processes Neck - supple, no significant adenopathy Chest - clear to auscultation, no wheezes, rales or rhonchi, symmetric air entry Heart - normal rate, regular rhythm, normal S1, S2, no murmurs, rubs, clicks or gallops Extremities - peripheral pulses normal, no pedal edema, no clubbing or cyanosis  Results for orders placed or performed in visit on 03/17/20  POCT glucose (manual entry)  Result Value Ref Range   POC Glucose 111 (A) 70 - 99 mg/dl  POCT glycosylated hemoglobin (Hb A1C)  Result Value Ref Range   Hemoglobin A1C     HbA1c POC (<> result, manual entry) 5.6 4.0 - 5.6 %   HbA1c, POC (prediabetic range)     HbA1c, POC (controlled diabetic range)      CMP Latest Ref Rng & Units 02/27/2020 09/06/2016  04/02/2013  Glucose 65 - 99 mg/dL 06/02/2013) 536(U) 440(H)  BUN 6 - 24 mg/dL 19 19 474(Q)  Creatinine 0.76 - 1.27 mg/dL 59(D) 6.38(V) 5.64(P)  Sodium 134 - 144 mmol/L 140 139 139  Potassium 3.5 - 5.2 mmol/L 3.6 3.2(L) 3.0(L)  Chloride 96 - 106 mmol/L 99 101 97  CO2 20 - 29 mmol/L 27 27  32  Calcium 8.7 - 10.2 mg/dL 9.3 8.9 8.8  Total Protein 6.0 - 8.5 g/dL 7.0 7.0 -  Total Bilirubin 0.0 - 1.2 mg/dL 0.6 0.9 -  Alkaline Phos 48 - 121 IU/L 102 67 -  AST 0 - 40 IU/L 21 44(H) -  ALT 0 - 44 IU/L 16 28 -   Lipid Panel     Component Value Date/Time   CHOL 184 02/27/2020 1401   TRIG 67 02/27/2020 1401   HDL 64 02/27/2020 1401   CHOLHDL 2.9 02/27/2020 1401   LDLCALC 107 (H) 02/27/2020 1401    CBC    Component Value Date/Time   WBC 5.5 02/27/2020 1401   WBC 11.2 (H) 09/06/2016 0513   RBC 5.57 02/27/2020 1401   RBC 5.38 09/06/2016 0513   HGB 15.6 02/27/2020 1401   HCT 47.7 02/27/2020 1401   PLT 235 02/27/2020 1401   MCV 86 02/27/2020 1401   MCH 28.0 02/27/2020 1401   MCH 29.2 09/06/2016 0513   MCHC 32.7 02/27/2020 1401   MCHC 34.0 09/06/2016 0513   RDW 12.6 02/27/2020 1401   LYMPHSABS 1.1 03/27/2010 1251   MONOABS 0.2 03/27/2010 1251   EOSABS 0.0 03/27/2010 1251   BASOSABS 0.0 03/27/2010 1251    ASSESSMENT AND PLAN: 1. Essential hypertension Not at goal.  Patient is asymptomatic.  Patient given 1 dose of clonidine today. -Increase the dose of carvedilol to 6.25 mg twice a day.  Continue Norvasc and hydrochlorothiazide. -I wanted him to follow-up with the clinical pharmacist in 1 week for blood pressure check but patient states he will be on vacation next week.  He is willing to come the following week. - carvedilol (COREG) 6.25 MG tablet; Take 1 tablet (6.25 mg total) by mouth 2 (two) times daily with a meal.  Dispense: 60 tablet; Refill: 6 - cloNIDine (CATAPRES) tablet 0.1 mg  2. Stage 3b chronic kidney disease Advised to avoid NSAIDs.  Need to get his blood pressure under  better control.  Referral to nephrology.  3. Pure hypercholesterolemia Discussed and encourage healthy eating habits.  4. Hyperglycemia A1c today is normal and blood sugar looks okay. - POCT glucose (manual entry) - POCT glycosylated hemoglobin (Hb A1C)  5. Influenza vaccination declined This was offered and patient declined.  6. COVID-19 virus vaccination declined Encouraged him to get the COVID-19 vaccine.  Informed him that the vaccines are relatively safe and effective.  Patient still not interested.    Patient was given the opportunity to ask questions.  Patient verbalized understanding of the plan and was able to repeat key elements of the plan.   No orders of the defined types were placed in this encounter.    Requested Prescriptions    No prescriptions requested or ordered in this encounter    No follow-ups on file.  Jonah Blue, MD, FACP

## 2020-03-17 NOTE — Patient Instructions (Signed)
Your blood pressure is not controlled. Our goal is to get you to 130/80 or lower. We have increased the carvedilol to 6.25 mg twice a day. Continue to monitor and record your blood pressure. We will have you follow-up in 2 weeks with our clinical pharmacist for blood pressure recheck.  I have referred you to a kidney specialist as your kidney function is not 100%. Avoid taking any over-the-counter pain medications like ibuprofen, Aleve, Advil or Naprosyn as these can make your kidney function worse.

## 2020-03-30 ENCOUNTER — Ambulatory Visit (INDEPENDENT_AMBULATORY_CARE_PROVIDER_SITE_OTHER): Payer: BC Managed Care – PPO | Admitting: Otolaryngology

## 2020-03-30 ENCOUNTER — Other Ambulatory Visit: Payer: Self-pay

## 2020-03-30 ENCOUNTER — Encounter (INDEPENDENT_AMBULATORY_CARE_PROVIDER_SITE_OTHER): Payer: Self-pay | Admitting: Otolaryngology

## 2020-03-30 VITALS — Temp 97.7°F

## 2020-03-30 DIAGNOSIS — Z87898 Personal history of other specified conditions: Secondary | ICD-10-CM | POA: Diagnosis not present

## 2020-03-30 NOTE — Progress Notes (Signed)
HPI: Bryce Sullivan is a 59 y.o. male who presents is referred by Dr. Jonah Blue for evaluation of episodic right-sided epistaxis.  Patient states that while working on a car something hit him in the nose and since that time he has had off-and-on nosebleeds only from the right nostril over the past few months..  The last time he had a nosebleed was the first week of September so it has been about 4 weeks since his last nosebleed.  He has had no nosebleeds from the left side.  Past Medical History:  Diagnosis Date  . Hypertension    Past Surgical History:  Procedure Laterality Date  . NO PAST SURGERIES     Social History   Socioeconomic History  . Marital status: Significant Other    Spouse name: Not on file  . Number of children: 3  . Years of education: Not on file  . Highest education level: 12th grade  Occupational History  . Not on file  Tobacco Use  . Smoking status: Never Smoker  . Smokeless tobacco: Never Used  Vaping Use  . Vaping Use: Never used  Substance and Sexual Activity  . Alcohol use: Not on file    Comment: occasionally  . Drug use: No  . Sexual activity: Not on file  Other Topics Concern  . Not on file  Social History Narrative  . Not on file   Social Determinants of Health   Financial Resource Strain:   . Difficulty of Paying Living Expenses: Not on file  Food Insecurity:   . Worried About Programme researcher, broadcasting/film/video in the Last Year: Not on file  . Ran Out of Food in the Last Year: Not on file  Transportation Needs:   . Lack of Transportation (Medical): Not on file  . Lack of Transportation (Non-Medical): Not on file  Physical Activity:   . Days of Exercise per Week: Not on file  . Minutes of Exercise per Session: Not on file  Stress:   . Feeling of Stress : Not on file  Social Connections:   . Frequency of Communication with Friends and Family: Not on file  . Frequency of Social Gatherings with Friends and Family: Not on file  . Attends Religious  Services: Not on file  . Active Member of Clubs or Organizations: Not on file  . Attends Banker Meetings: Not on file  . Marital Status: Not on file   Family History  Problem Relation Age of Onset  . Hypertension Mother   . Hypertension Father   . Hypertension Sister   . Diabetes Sister   . Hypertension Brother   . Diabetes Brother    No Known Allergies Prior to Admission medications   Medication Sig Start Date End Date Taking? Authorizing Provider  amLODipine (NORVASC) 10 MG tablet Take 1 tablet (10 mg total) by mouth daily. 02/27/20  Yes Marcine Matar, MD  carvedilol (COREG) 6.25 MG tablet Take 1 tablet (6.25 mg total) by mouth 2 (two) times daily with a meal. 03/17/20  Yes Marcine Matar, MD  hydrochlorothiazide (HYDRODIURIL) 12.5 MG tablet Take 1 tablet (12.5 mg total) by mouth daily. 02/27/20  Yes Marcine Matar, MD  chlorthalidone (HYGROTON) 25 MG tablet Take 1 tablet (25 mg total) by mouth daily. 04/02/13 02/26/20  Horton, Mayer Masker, MD  metoprolol succinate (TOPROL-XL) 100 MG 24 hr tablet Take 1 tablet (100 mg total) by mouth at bedtime. 04/02/13 02/26/20  Horton, Mayer Masker, MD  NIFEdipine (  PROCARDIA-XL/ADALAT CC) 60 MG 24 hr tablet Take 1 tablet (60 mg total) by mouth daily. 04/02/13 02/26/20  Horton, Mayer Masker, MD     Positive ROS: Otherwise negative  All other systems have been reviewed and were otherwise negative with the exception of those mentioned in the HPI and as above.  Physical Exam: Constitutional: Alert, well-appearing, no acute distress Ears: External ears without lesions or tenderness. Ear canals are clear bilaterally with intact, clear TMs.  Nasal: External nose without lesions. Septum with mild deformity but clear nasal passages otherwise.  Nasal endoscopy was performed on the right side and on nasal endoscopy the posterior nasal cavity was clear the nasopharynx was clear.  I cannot identify a site of origin of his epistaxis.  Anterior septum  likewise was clear although there are several small vessels but none of them bled easily on rubbing with a Q-tip.  No intranasal lesions noted. Oral: Lips and gums without lesions. Tongue and palate mucosa without lesions. Posterior oropharynx clear. Neck: No palpable adenopathy or masses Respiratory: Breathing comfortably  Skin: No facial/neck lesions or rash noted.  Nasal/sinus endoscopy  Date/Time: 03/30/2020 5:18 PM Performed by: Drema Halon, MD Authorized by: Drema Halon, MD   Consent:    Consent obtained:  Verbal   Consent given by:  Patient Procedure details:    Indications: sino-nasal symptoms     Medication:  Afrin   Scope location: right nare   Septum:    normal   Sinus:    Right middle meatus: normal     Right nasopharynx: normal   Comments:     On nasal endoscopy right nasal cavity was clear and I cannot identify definite site of origin of his episodes of epistaxis.    Assessment: History of right-sided epistaxis  Plan: Reviewed with the patient as well as his wife that the nasal cavity was clear on nasal endoscopy no abnormal lesions or origin of epistaxis can be identified. I discussed with him concerning using anterior cottonball packing with Afrin if he has any further episodes of epistaxis and will follow up here if he has recurrent epistaxis for cauterization if needed.   Narda Bonds, MD   CC:

## 2020-03-31 ENCOUNTER — Encounter: Payer: Self-pay | Admitting: Pharmacist

## 2020-03-31 ENCOUNTER — Other Ambulatory Visit: Payer: Self-pay

## 2020-03-31 ENCOUNTER — Ambulatory Visit: Payer: BC Managed Care – PPO | Attending: Internal Medicine | Admitting: Pharmacist

## 2020-03-31 VITALS — BP 149/96 | HR 78

## 2020-03-31 DIAGNOSIS — I10 Essential (primary) hypertension: Secondary | ICD-10-CM

## 2020-03-31 MED ORDER — CARVEDILOL 12.5 MG PO TABS: 12.5000 mg | ORAL_TABLET | Freq: Two times a day (BID) | ORAL | 3 refills | Status: DC

## 2020-03-31 NOTE — Progress Notes (Signed)
° °  S: PCP:  Dr. Laural Benes    Patient arrives in good spirits. Presents to the clinic for hypertension evaluation, counseling, and management.  Patient was referred and last seen by Primary Care Provider on 03/17/2020. At that visit, Dr. Laural Benes increased Coreg dose to 6.25 mg BID.   Medication adherence reported. Denies any medication side effects. Denies chest pains, dyspnea, dizziness, or fatigue.   Current BP Medications include:  Amlodipine 10 mg daily, carvedilol 6.25 mg BID HCTZ 12.5 mg daily  Dietary habits include: compliant with salt restriction Exercise habits include: none reported  Family / Social history:  - FHx: HTN, DM  -Tobacco: never smoker - Alcohol: occasional use    O:  Vitals:   03/31/20 0947 03/31/20 0948  BP: (!) 169/115 (!) 149/96  Pulse: 74 78   Home BP readings:   SBP: 151-157 DBP: 85-90  Last 3 Office BP readings: BP Readings from Last 3 Encounters:  03/17/20 (!) 166/114  02/26/20 (!) 207/130  09/07/16 (!) 159/99    BMET    Component Value Date/Time   NA 140 02/27/2020 1401   K 3.6 02/27/2020 1401   CL 99 02/27/2020 1401   CO2 27 02/27/2020 1401   GLUCOSE 203 (H) 02/27/2020 1401   GLUCOSE 190 (H) 09/06/2016 0513   BUN 19 02/27/2020 1401   CREATININE 2.14 (H) 02/27/2020 1401   CALCIUM 9.3 02/27/2020 1401   GFRNONAA 33 (L) 02/27/2020 1401   GFRAA 38 (L) 02/27/2020 1401    Renal function: CrCl cannot be calculated (Patient's most recent lab result is older than the maximum 21 days allowed.).  Clinical ASCVD: No  The ASCVD Risk score Denman George DC Jr., et al., 2013) failed to calculate for the following reasons:   Unable to determine if patient is Non-Hispanic African American   A/P: Hypertension longstanding currently above goal but improving on current medications. BP Goal = < 130/80 mmHg. Medication adherence reported.  -Increased dose of carvedilol to 12.5 mg BID.  -Continued other medications -Counseled on lifestyle modifications for  blood pressure control including reduced dietary sodium, increased exercise, adequate sleep.  Results reviewed and written information provided. Total time in face-to-face counseling 15 minutes.   F/U Clinic Visit in ~1 month with PCP.    Patient seen with:  Levada Schilling, PharmD Candidate  [employer]  Butch Penny, PharmD, CPP Clinical Pharmacist [facility] [phone]

## 2020-03-31 NOTE — Patient Instructions (Signed)
Thank you for coming to see [place] today.   Blood pressure today is improving  Start taking carvedilol 12.5 mg twice daily. Continue all other medications.   Limiting salt and caffeine, as well as exercising as able for at least 30 minutes for 5 days out of the week, can also help you lower your blood pressure.  Take your blood pressure at home if you are able. Please write down these numbers and bring them to your visits.  If you have any questions about medications, please call me [phone].  Franky Macho

## 2020-05-04 ENCOUNTER — Other Ambulatory Visit: Payer: Self-pay | Admitting: Nephrology

## 2020-05-04 DIAGNOSIS — N1832 Chronic kidney disease, stage 3b: Secondary | ICD-10-CM

## 2020-05-05 ENCOUNTER — Ambulatory Visit
Admission: RE | Admit: 2020-05-05 | Discharge: 2020-05-05 | Disposition: A | Discharge status: Home / Self Care | Payer: BC Managed Care – PPO | Source: Ambulatory Visit | Attending: Nephrology | Admitting: Nephrology

## 2020-05-05 DIAGNOSIS — N1832 Chronic kidney disease, stage 3b: Secondary | ICD-10-CM

## 2020-05-07 ENCOUNTER — Other Ambulatory Visit: Payer: Self-pay

## 2020-05-07 ENCOUNTER — Ambulatory Visit: Payer: BC Managed Care – PPO | Attending: Internal Medicine | Admitting: Internal Medicine

## 2020-05-07 ENCOUNTER — Encounter: Payer: Self-pay | Admitting: Internal Medicine

## 2020-05-07 VITALS — BP 154/97 | HR 71 | Resp 16 | Wt 197.4 lb

## 2020-05-07 DIAGNOSIS — Z1211 Encounter for screening for malignant neoplasm of colon: Secondary | ICD-10-CM | POA: Diagnosis not present

## 2020-05-07 DIAGNOSIS — I10 Essential (primary) hypertension: Secondary | ICD-10-CM

## 2020-05-07 DIAGNOSIS — N1832 Chronic kidney disease, stage 3b: Secondary | ICD-10-CM

## 2020-05-07 MED ORDER — HYDROCHLOROTHIAZIDE 12.5 MG PO TABS: 12.5000 mg | ORAL_TABLET | Freq: Every day | ORAL | 3 refills | Status: DC

## 2020-05-07 MED ORDER — AMLODIPINE BESYLATE 10 MG PO TABS: 10.0000 mg | ORAL_TABLET | Freq: Every day | ORAL | 2 refills | Status: DC

## 2020-05-07 MED ORDER — CARVEDILOL 12.5 MG PO TABS: 18.7500 mg | ORAL_TABLET | Freq: Two times a day (BID) | ORAL | 3 refills | Status: DC

## 2020-05-07 NOTE — Progress Notes (Signed)
Patient ID: Bryce Sullivan, male    DOB: 1961/05/08  MRN: [id number]  CC: Hypertension   Subjective: Bryce Sullivan is a 59 y.o. male who presents for chronic ds management.  His wife is with him His concerns today include:  Pt with hx of HTN, epistaxis, HL, CKD 3  HTN/CKD 3:  Saw Nephrology 04/28/2020.  Lisinopril 10 mg added.  Blood and UA test done at that visit. Planned for Lab again next wk through [facility].  I have not received note as yet from the nephrologist. Kidney US nl.  Took meds already for today.  He is on HCTZ, carvedilol, Norvasc and now lisinopril. Checks BP regularly at home but forgot to bring log.  SBP in the 150s, diastolic in the 90s.  Lowest SBP was 149.  Denies any chest pains, shortness of breath, lower extremity edema  HM: reports having had c-scope many yrs ago. He thinks it was nl.  No fhx of colon CA  Patient Active Problem List   Diagnosis Date Noted  . Stage 3b chronic kidney disease (HCC) 03/17/2020  . Pure hypercholesterolemia 03/17/2020  . Hyperglycemia 03/17/2020  . Influenza vaccination declined 03/17/2020  . COVID-19 virus vaccination declined 03/17/2020  . Essential hypertension 02/27/2020  . Epistaxis 02/27/2020  . Traumatic subarachnoid hemorrhage (HCC) 09/06/2016     Current Outpatient Medications on File Prior to Visit  Medication Sig Dispense Refill  . lisinopril (ZESTRIL) 10 MG tablet Take 10 mg by mouth daily.    . [DISCONTINUED] chlorthalidone (HYGROTON) 25 MG tablet Take 1 tablet (25 mg total) by mouth daily. 30 tablet 0  . [DISCONTINUED] metoprolol succinate (TOPROL-XL) 100 MG 24 hr tablet Take 1 tablet (100 mg total) by mouth at bedtime. 30 tablet 0  . [DISCONTINUED] NIFEdipine (PROCARDIA-XL/ADALAT CC) 60 MG 24 hr tablet Take 1 tablet (60 mg total) by mouth daily. 30 tablet 0   No current facility-administered medications on file prior to visit.    No Known Allergies  Social History   Socioeconomic History  . Marital status:  Significant Other    Spouse name: Not on file  . Number of children: 3  . Years of education: Not on file  . Highest education level: 12th grade  Occupational History  . Not on file  Tobacco Use  . Smoking status: Never Smoker  . Smokeless tobacco: Never Used  Vaping Use  . Vaping Use: Never used  Substance and Sexual Activity  . Alcohol use: Not on file    Comment: occasionally  . Drug use: No  . Sexual activity: Not on file  Other Topics Concern  . Not on file  Social History Narrative  . Not on file   Social Determinants of Health   Financial Resource Strain:   . Difficulty of Paying Living Expenses: Not on file  Food Insecurity:   . Worried About Programme researcher, broadcasting/film/video in the Last Year: Not on file  . Ran Out of Food in the Last Year: Not on file  Transportation Needs:   . Lack of Transportation (Medical): Not on file  . Lack of Transportation (Non-Medical): Not on file  Physical Activity:   . Days of Exercise per Week: Not on file  . Minutes of Exercise per Session: Not on file  Stress:   . Feeling of Stress : Not on file  Social Connections:   . Frequency of Communication with Friends and Family: Not on file  . Frequency of Social Gatherings with Friends and  Family: Not on file  . Attends Religious Services: Not on file  . Active Member of Clubs or Organizations: Not on file  . Attends Banker Meetings: Not on file  . Marital Status: Not on file  Intimate Partner Violence:   . Fear of Current or Ex-Partner: Not on file  . Emotionally Abused: Not on file  . Physically Abused: Not on file  . Sexually Abused: Not on file    Family History  Problem Relation Age of Onset  . Hypertension Mother   . Hypertension Father   . Hypertension Sister   . Diabetes Sister   . Hypertension Brother   . Diabetes Brother     Past Surgical History:  Procedure Laterality Date  . NO PAST SURGERIES      ROS: Review of Systems Negative except as stated  above  PHYSICAL EXAM: BP (!) 154/97   Pulse 71   Resp 16   Wt 197 lb 6.4 oz (89.5 kg)   SpO2 97%   BMI 27.53 kg/m   Physical Exam  General appearance - alert, well appearing, and in no distress Mental status - normal mood, behavior, speech, dress, motor activity, and thought processes Chest - clear to auscultation, no wheezes, rales or rhonchi, symmetric air entry Heart - normal rate, regular rhythm, normal S1, S2, no murmurs, rubs, clicks or gallops Extremities - peripheral pulses normal, no pedal edema, no clubbing or cyanosis  CMP Latest Ref Rng & Units 02/27/2020 09/06/2016 04/02/2013  Glucose 65 - 99 mg/dL 213(Y) 865(H) 846(N)  BUN 6 - 24 mg/dL 19 19 62(X)  Creatinine 0.76 - 1.27 mg/dL 5.28(U) 1.32(G) 4.01(U)  Sodium 134 - 144 mmol/L 140 139 139  Potassium 3.5 - 5.2 mmol/L 3.6 3.2(L) 3.0(L)  Chloride 96 - 106 mmol/L 99 101 97  CO2 20 - 29 mmol/L 27 27 32  Calcium 8.7 - 10.2 mg/dL 9.3 8.9 8.8  Total Protein 6.0 - 8.5 g/dL 7.0 7.0 -  Total Bilirubin 0.0 - 1.2 mg/dL 0.6 0.9 -  Alkaline Phos 48 - 121 IU/L 102 67 -  AST 0 - 40 IU/L 21 44(H) -  ALT 0 - 44 IU/L 16 28 -   Lipid Panel     Component Value Date/Time   CHOL 184 02/27/2020 1401   TRIG 67 02/27/2020 1401   HDL 64 02/27/2020 1401   CHOLHDL 2.9 02/27/2020 1401   LDLCALC 107 (H) 02/27/2020 1401    CBC    Component Value Date/Time   WBC 5.5 02/27/2020 1401   WBC 11.2 (H) 09/06/2016 0513   RBC 5.57 02/27/2020 1401   RBC 5.38 09/06/2016 0513   HGB 15.6 02/27/2020 1401   HCT 47.7 02/27/2020 1401   PLT 235 02/27/2020 1401   MCV 86 02/27/2020 1401   MCH 28.0 02/27/2020 1401   MCH 29.2 09/06/2016 0513   MCHC 32.7 02/27/2020 1401   MCHC 34.0 09/06/2016 0513   RDW 12.6 02/27/2020 1401   LYMPHSABS 1.1 03/27/2010 1251   MONOABS 0.2 03/27/2010 1251   EOSABS 0.0 03/27/2010 1251   BASOSABS 0.0 03/27/2010 1251    ASSESSMENT AND PLAN: 1. Essential hypertension Not at goal but improved. Increase carvedilol to 18.75  mg twice a day. Continue carvedilol, amlodipine, hydrochlorothiazide and lisinopril Continue home blood pressure monitoring and bring in readings in 1 month to see our clinical pharmacist. - amLODipine (NORVASC) 10 MG tablet; Take 1 tablet (10 mg total) by mouth daily.  Dispense: 90 tablet; Refill: 2 -  hydrochlorothiazide (HYDRODIURIL) 12.5 MG tablet; Take 1 tablet (12.5 mg total) by mouth daily.  Dispense: 90 tablet; Refill: 3 - carvedilol (COREG) 12.5 MG tablet; Take 1.5 tablets (18.75 mg total) by mouth 2 (two) times daily with a meal.  Dispense: 90 tablet; Refill: 3  2. Stage 3b chronic kidney disease (HCC) Recently seen by nephrology. Not on NSAIDs.  3. Screening for colon cancer Discussed colon cancer screening methods.  He is at average risk.  Patient declines having colonoscopy again.  He prefers to do the Cologuard. - Cologuard    Patient was given the opportunity to ask questions.  Patient verbalized understanding of the plan and was able to repeat key elements of the plan.   Orders Placed This Encounter  Procedures  . Cologuard     Requested Prescriptions   Signed Prescriptions Disp Refills  . amLODipine (NORVASC) 10 MG tablet 90 tablet 2    Sig: Take 1 tablet (10 mg total) by mouth daily.  . hydrochlorothiazide (HYDRODIURIL) 12.5 MG tablet 90 tablet 3    Sig: Take 1 tablet (12.5 mg total) by mouth daily.  . carvedilol (COREG) 12.5 MG tablet 90 tablet 3    Sig: Take 1.5 tablets (18.75 mg total) by mouth 2 (two) times daily with a meal.    Return in about 3 months (around 08/07/2020) for Give appt with Franky Macho in 1 mth for BP recheck.  Jonah Blue, MD, FACP

## 2020-05-07 NOTE — Patient Instructions (Signed)
Your blood pressure has improved but it is not at goal.  I recommend increasing the carvedilol to 12.5 mg taking 1-1/2 tablets twice a day.  Continue current dose of the hydrochlorothiazide, amlodipine and lisinopril.  Follow-up in 1 month with our clinical pharmacist for repeat blood pressure check.  Please bring a log of your blood pressure readings with you.

## 2020-05-22 LAB — COLOGUARD: Cologuard: NEGATIVE

## 2020-05-27 ENCOUNTER — Encounter: Payer: Self-pay | Admitting: Internal Medicine

## 2020-05-27 NOTE — Progress Notes (Signed)
Patient seen by Dr. Marisue Humble 04/28/2020. UA revealed trace protein and trace blood. BUN 27, creatinine 1.69, GFR 50, potassium 4.1, PTH 70, urine protein albumin ratio 55. SPEP normal. CKD 3 likely due to HTN. Started HCTZ 12.5 mg daily, carvedilol 6.25 mg twice daily, amlodipine 10 mg daily. Follow-up in 2 months

## 2020-05-29 LAB — EXTERNAL GENERIC LAB PROCEDURE: COLOGUARD: NEGATIVE

## 2020-06-03 ENCOUNTER — Telehealth: Payer: Self-pay | Admitting: Internal Medicine

## 2020-06-03 ENCOUNTER — Other Ambulatory Visit (INDEPENDENT_AMBULATORY_CARE_PROVIDER_SITE_OTHER): Payer: Self-pay

## 2020-06-03 NOTE — Telephone Encounter (Signed)
Contacted pt and went over cologuard results pt is aware and doesn't have any questions or concerns  

## 2020-06-05 ENCOUNTER — Other Ambulatory Visit: Payer: Self-pay | Admitting: Internal Medicine

## 2020-06-05 DIAGNOSIS — I10 Essential (primary) hypertension: Secondary | ICD-10-CM

## 2020-06-08 ENCOUNTER — Ambulatory Visit: Payer: BC Managed Care – PPO | Admitting: Pharmacist

## 2020-07-15 ENCOUNTER — Telehealth: Payer: Self-pay | Admitting: Internal Medicine

## 2020-07-15 NOTE — Telephone Encounter (Signed)
Pt states he is not taking HCTZ

## 2020-07-15 NOTE — Telephone Encounter (Signed)
I received note from patient's nephrologist Dr. Marisue Humble.  He was seen 07/07/2020.  Assessment was CKD stage III likely due to HTN.  Patient's blood pressure on this visit was 154/86.  Assessed to need improvement in blood pressure control.  HCTZ was discontinued.  Chlorthalidone 12.5 mg every day was started instead.  Lisinopril increased to 20 mg at bedtime.  Labs reveal PTH of 70, creatinine 1.97, GFR 42, hemoglobin 13.5.

## 2020-08-07 ENCOUNTER — Ambulatory Visit: Payer: BC Managed Care – PPO | Attending: Internal Medicine | Admitting: Internal Medicine

## 2020-08-07 ENCOUNTER — Other Ambulatory Visit: Payer: Self-pay

## 2020-08-07 DIAGNOSIS — E7800 Pure hypercholesterolemia, unspecified: Secondary | ICD-10-CM

## 2020-08-07 DIAGNOSIS — N1831 Chronic kidney disease, stage 3a: Secondary | ICD-10-CM | POA: Diagnosis not present

## 2020-08-07 DIAGNOSIS — I10 Essential (primary) hypertension: Secondary | ICD-10-CM

## 2020-08-07 MED ORDER — CHLORTHALIDONE 25 MG PO TABS: 25.0000 mg | ORAL_TABLET | Freq: Every morning | ORAL | 6 refills | Status: DC

## 2020-08-07 NOTE — Progress Notes (Signed)
Virtual Visit via Telephone Note  I connected with Bryce Sullivan on 08/07/20 at 9:28 a.m by telephone and verified that I am speaking with the correct person using two identifiers.  Location: Patient: work Provider: office  The patient, my CMA Ms. Donata Duff and myself participated in this encounter.  I discussed the limitations, risks, security and privacy concerns of performing an evaluation and management service by telephone and the availability of in person appointments. I also discussed with the patient that there may be a patient responsible charge related to this service. The patient expressed understanding and agreed to proceed.   History of Present Illness: Pt with hx of HTN, epistaxis, HL, CKD 3.  Last seen 04/2020.  Purpose of today's visit is chronic disease management.  HTN/CKD 3: saw his nephrologist since last visit.  Lisinopril increased to 20 mg, HCTZ discontinue.  Chlorthalidone 25 mg 1/2 daily started instead. However patient states that the chlorthalidone pill is too small to be cut in half so he has been taking the full pill. In regards to the carvedilol. He is supposed to be on 12.5 mg 1-1/2 tablets twice a day. However patient states that he has been taking 2 tablets twice a day. He claims that the pills are too small also to be cut in half Checks BP every night after work.  Gives range 140s/76-81 No CP/SOB/LE edema/HA/dizziness Limits salt in foods  Outpatient Encounter Medications as of 08/07/2020  Medication Sig  . amLODipine (NORVASC) 10 MG tablet Take 1 tablet (10 mg total) by mouth daily.  . carvedilol (COREG) 12.5 MG tablet Take 1.5 tablets (18.75 mg total) by mouth 2 (two) times daily with a meal.  . hydrochlorothiazide (HYDRODIURIL) 12.5 MG tablet Take 1 tablet (12.5 mg total) by mouth daily.  Marland Kitchen lisinopril (ZESTRIL) 10 MG tablet Take 10 mg by mouth daily.  . [DISCONTINUED] chlorthalidone (HYGROTON) 25 MG tablet Take 1 tablet (25 mg total) by mouth daily.  .  [DISCONTINUED] metoprolol succinate (TOPROL-XL) 100 MG 24 hr tablet Take 1 tablet (100 mg total) by mouth at bedtime.  . [DISCONTINUED] NIFEdipine (PROCARDIA-XL/ADALAT CC) 60 MG 24 hr tablet Take 1 tablet (60 mg total) by mouth daily.   No facility-administered encounter medications on file as of 08/07/2020.      Observations/Objective: Results for orders placed or performed in visit on 06/03/20  Cologuard  Result Value Ref Range   Cologuard Negative Negative     Chemistry      Component Value Date/Time   NA 140 02/27/2020 1401   K 3.6 02/27/2020 1401   CL 99 02/27/2020 1401   CO2 27 02/27/2020 1401   BUN 19 02/27/2020 1401   CREATININE 2.14 (H) 02/27/2020 1401      Component Value Date/Time   CALCIUM 9.3 02/27/2020 1401   ALKPHOS 102 02/27/2020 1401   AST 21 02/27/2020 1401   ALT 16 02/27/2020 1401   BILITOT 0.6 02/27/2020 1401     Lab Results  Component Value Date   WBC 5.5 02/27/2020   HGB 15.6 02/27/2020   HCT 47.7 02/27/2020   MCV 86 02/27/2020   PLT 235 02/27/2020   Lab Results  Component Value Date   CHOL 184 02/27/2020   HDL 64 02/27/2020   LDLCALC 107 (H) 02/27/2020   TRIG 67 02/27/2020   CHOLHDL 2.9 02/27/2020     Assessment and Plan: 1. Essential hypertension Reported home blood pressure readings not at goal. We will have him come in next week to see  the clinical pharmacist for blood pressure check and medication verification. I have asked that he bring his medications with him for verification. I have changed the chlorthalidone from 25 mg half a tablet daily to a full tablet daily. He should also be on lisinopril 20 mg daily, amlodipine 10 mg daily and carvedilol 12.5 mg 1-1/2 tablets daily. However he tells me that he has been taking 2 of the carvedilol twice a day. I will have our pharmacist verify - Basic Metabolic Panel; Future - chlorthalidone (HYGROTON) 25 MG tablet; Take 1 tablet (25 mg total) by mouth every morning.  Dispense: 30 tablet; Refill:  6  2. Stage 3a chronic kidney disease (HCC) Followed by nephrology - Basic Metabolic Panel; Future  3. Pure hypercholesterolemia - Lipid panel; Future   Follow Up Instructions: 4 months in person   I discussed the assessment and treatment plan with the patient. The patient was provided an opportunity to ask questions and all were answered. The patient agreed with the plan and demonstrated an understanding of the instructions.   The patient was advised to call back or seek an in-person evaluation if the symptoms worsen or if the condition fails to improve as anticipated.  I provided 10 minutes of non-face-to-face time during this encounter.   Jonah Blue, MD

## 2020-08-11 ENCOUNTER — Ambulatory Visit: Payer: BC Managed Care – PPO | Attending: Internal Medicine | Admitting: Pharmacist

## 2020-08-11 ENCOUNTER — Other Ambulatory Visit: Payer: Self-pay

## 2020-08-11 ENCOUNTER — Encounter: Payer: Self-pay | Admitting: Pharmacist

## 2020-08-11 DIAGNOSIS — I10 Essential (primary) hypertension: Secondary | ICD-10-CM

## 2020-08-11 DIAGNOSIS — N1831 Chronic kidney disease, stage 3a: Secondary | ICD-10-CM

## 2020-08-11 DIAGNOSIS — E7800 Pure hypercholesterolemia, unspecified: Secondary | ICD-10-CM | POA: Diagnosis not present

## 2020-08-11 MED ORDER — CARVEDILOL 12.5 MG PO TABS: 25.0000 mg | ORAL_TABLET | Freq: Two times a day (BID) | ORAL | 1 refills | Status: DC

## 2020-08-11 NOTE — Progress Notes (Signed)
   PCP: Dr. Laural Benes  S:    Patient presents to the clinic for BP medication reconciliation and hypertension evaluation, counseling, and management. Patient was referred and last seen by Primary Care Provider on 08/07/2020. At that visit, no changes were made to his antihypertensives.  Medication adherence reported, however he reports being out of his amlodipine for about 1 month now. He denies HA, lightheadedness, dizziness, chest pain and SOB. He reports he was unable to cut tablets in half so he has been taking two 12.5mg  tablets of carvedilol twice daily and started out taking 1 tablet of chlorthalidone 25 mg instead of 1/2 tablet.  Current BP Medications include:  carvedilol 12.5 mg- 2 tablets BID, lisinopril 20 mg daily, chlorthalidone 25 mg daily (pt brought medication bottles with him today)  Dietary habits include: - compliance with salt restriction  - eats salads  - has 1 energy drink per week  Exercise habits include:  - works 2 jobs driving trucks and [employer] / Social history: - never smoker - 1 alcholic beverage per night  O:  Vitals:   08/11/20 0904  BP: 121/82  Pulse: 76   Home BP readings:  Systolics: 120- 140 Diastolics: 70-78 - more recently 120s/70s  Last 3 Office BP readings: BP Readings from Last 3 Encounters:  08/11/20 121/82  05/07/20 (!) 154/97  03/31/20 (!) 149/96   BMET    Component Value Date/Time   NA 140 02/27/2020 1401   K 3.6 02/27/2020 1401   CL 99 02/27/2020 1401   CO2 27 02/27/2020 1401   GLUCOSE 203 (H) 02/27/2020 1401   GLUCOSE 190 (H) 09/06/2016 0513   BUN 19 02/27/2020 1401   CREATININE 2.14 (H) 02/27/2020 1401   CALCIUM 9.3 02/27/2020 1401   GFRNONAA 33 (L) 02/27/2020 1401   GFRAA 38 (L) 02/27/2020 1401   Renal function: CrCl cannot be calculated (Patient's most recent lab result is older than the maximum 21 days allowed.).  Clinical ASCVD: NO The ASCVD Risk score Denman George DC Jr., et al., 2013) failed to calculate  for the following reasons:   Unable to determine if patient is Non-Hispanic African American  A/P: Hypertension longstanding currently controlled on current medications. BP Goal = < 130/80 mmHg. Medication adherence reported. Due to inability to cut tablets, pt self increased chlorthalidone 25 mg to 1 tablet daily and carvedilol 12.5 mg to 2 tablets BID. Due to BP at goal in office today and improved home readings, recommend not restarting amlodipine at this time. - Continue carvedilol 12.5 mg- 2 tablets BID - Continue chlorthalidone 25 mg daily - Continue lisinopril 20 mg daily - Instructed pt to hold amlodipine; will reach out to PCP regarding today's visit.  - F/u labs ordered - BMP and lipid panel today - Counseled on lifestyle modifications for blood pressure control including reduced dietary sodium, increased exercise, adequate sleep.  Results reviewed and written information provided.   Total time in face-to-face counseling 30 minutes. F/u appt with Dr. Laural Benes pending lab results.  Adam Phenix, PharmD Candidate  [employer]  Butch Penny, PharmD, [place], CPP Clinical Pharmacist [facility] [phone]

## 2020-08-12 ENCOUNTER — Other Ambulatory Visit: Payer: Self-pay | Admitting: Internal Medicine

## 2020-08-12 LAB — LIPID PANEL
Chol/HDL Ratio: 3.1 ratio (ref 0.0–5.0)
Cholesterol, Total: 179 mg/dL (ref 100–199)
HDL: 57 mg/dL (ref >39)
LDL Chol Calc (NIH): 102 mg/dL — ABNORMAL HIGH (ref 0–99)
Triglycerides: 111 mg/dL (ref 0–149)
VLDL Cholesterol Cal: 20 mg/dL (ref 5–40)

## 2020-08-12 LAB — BASIC METABOLIC PANEL
BUN/Creatinine Ratio: 18 (ref 9–20)
BUN: 38 mg/dL — ABNORMAL HIGH (ref 6–24)
CO2: 23 mmol/L (ref 20–29)
Calcium: 9 mg/dL (ref 8.7–10.2)
Chloride: 100 mmol/L (ref 96–106)
Creatinine, Ser: 2.07 mg/dL — ABNORMAL HIGH (ref 0.76–1.27)
GFR calc Af Amer: 39 mL/min/{1.73_m2} — ABNORMAL LOW (ref >59)
GFR calc non Af Amer: 34 mL/min/{1.73_m2} — ABNORMAL LOW (ref >59)
Glucose: 104 mg/dL — ABNORMAL HIGH (ref 65–99)
Potassium: 3.7 mmol/L (ref 3.5–5.2)
Sodium: 142 mmol/L (ref 134–144)

## 2020-08-12 NOTE — Progress Notes (Signed)
Let patient know that his LDL cholesterol is 102 with goal being less than 100.  Healthy eating habits and regular exercise will help to lower cholesterol.  His kidney function is not 100% and slightly worse compared to when it was checked by Dr. Marisue Humble a few months ago.  Continue medications as instructed by the clinical pharmacist whom he saw yesterday.  Please send a copy of these labs to patient's nephrologist Dr. Marisue Humble with [facility].

## 2020-08-14 ENCOUNTER — Telehealth: Payer: Self-pay

## 2020-08-14 NOTE — Telephone Encounter (Signed)
Contacted pt to go over lab results pt didn't answer lvm  

## 2020-08-18 ENCOUNTER — Other Ambulatory Visit: Payer: Self-pay | Admitting: Internal Medicine

## 2020-08-18 DIAGNOSIS — I10 Essential (primary) hypertension: Secondary | ICD-10-CM

## 2020-11-04 ENCOUNTER — Encounter: Payer: Self-pay | Admitting: Internal Medicine

## 2020-11-04 NOTE — Progress Notes (Signed)
See the note from Dr. Sabra Heck.  Patient seen 10/22/2020. GFR 40/creatinine 1.88 CKD stage III Hypertension renal disease Patient to continue chlorthalidone 25 mg daily, lisinopril 20 mg daily, amlodipine 10 mg daily.  Need for better blood pressure control.  Resume carvedilol which patient thinks fell off of his med list.  No current indication for SGLT2 inhibitor.  Of note, I do not see Norvasc on pt's current med list with me.

## 2021-02-09 ENCOUNTER — Other Ambulatory Visit: Payer: Self-pay | Admitting: Internal Medicine

## 2021-02-09 DIAGNOSIS — I10 Essential (primary) hypertension: Secondary | ICD-10-CM

## 2021-02-10 ENCOUNTER — Encounter: Payer: Self-pay | Admitting: Internal Medicine

## 2021-02-10 NOTE — Progress Notes (Signed)
Seen by Dr. Marisue Humble 02/04/21. CKD 3b likely due to HTN.  Continue carvedilol 12.5 mg 2 tablets twice a day, chlorthalidone 25 mg daily and amlodipine 10 mg daily. Blood test done 01/25/2021: BUN 33, creatinine 2.08, GFR 36, potassium 3.3

## 2021-08-17 ENCOUNTER — Other Ambulatory Visit: Payer: Self-pay | Admitting: Internal Medicine

## 2021-08-17 DIAGNOSIS — I10 Essential (primary) hypertension: Secondary | ICD-10-CM

## 2021-08-17 NOTE — Telephone Encounter (Signed)
Requested medication (s) are due for refill today: yes  Requested medication (s) are on the active medication list: yes  Last refill:  02/09/21 for both  Future visit scheduled: no  Notes to clinic:  pt is overdue for updated labs and an appt.      Requested Prescriptions  Pending Prescriptions Disp Refills   carvedilol (COREG) 12.5 MG tablet [Pharmacy Med Name: CARVEDILOL 12.5 MG TABLET] 360 tablet 1    Sig: TAKE 2 TABLETS BY MOUTH 2 TIMES DAILY WITH A MEAL.     Cardiovascular: Beta Blockers 3 Failed - 08/17/2021  1:45 AM      Failed - Cr in normal range and within 360 days    Creatinine, Ser  Date Value Ref Range Status  08/11/2020 2.07 (H) 0.76 - 1.27 mg/dL Final          Failed - AST in normal range and within 360 days    AST  Date Value Ref Range Status  02/27/2020 21 0 - 40 IU/L Final          Failed - ALT in normal range and within 360 days    ALT  Date Value Ref Range Status  02/27/2020 16 0 - 44 IU/L Final          Failed - Valid encounter within last 6 months    Recent Outpatient Visits           1 year ago Essential hypertension   [facility] Mustang, Cornelius Moras, RPH-CPP   1 year ago Essential hypertension   [facility] Marcine Matar, MD   1 year ago Essential hypertension   [facility] Marcine Matar, MD   1 year ago Essential hypertension   [facility] Drucilla Chalet, RPH-CPP   1 year ago Essential hypertension    [facility] Marcine [facility]ar, [facility]              Passed - Last BP in normal range    BP Readings from Last 1 Encounters:  08/11/20 121/82          Passed - Last Heart Rate in normal range    Pulse Readings from Last 1 Encounters:  08/11/20 76           chlorthalidone (HYGROTON) 25 MG tablet [Pharmacy Med Name: CHLORTHALIDONE 25 MG TABLET] 90 tablet 1    Sig:  TAKE 1 TABLET BY MOUTH EVERY DAY IN THE MORNING     Cardiovascular: Diuretics - Thiazide Failed - 08/17/2021  1:45 AM      Failed - Cr in normal range and within 180 days    Creatinine, Ser  Date Value Ref Range Status  08/11/2020 2.07 (H) 0.76 - 1.27 mg/dL Final          Failed - K in normal range and within 180 days    Potassium  Date Value Ref Range Status  08/11/2020 3.7 3.5 - 5.2 mmol/L Final          Failed - Na in normal range and within 180 days    Sodium  Date Value Ref Range Status  08/11/2020 142 134 - 144 mmol/L Final          Failed - Valid encounter within last 6 months    Recent Outpatient Visits           1 year  ago Essential hypertension   [facility] Lois Huxley, Cornelius Moras, RPH-CPP   1 year ago Essential hypertension   [facility] Marcine Matar, MD   1 year ago Essential hypertension   [facility] Marcine Matar, MD   1 year ago Essential hypertension   [facility] Lois Huxley, Cornelius Moras, RPH-CPP   1 year ago Essential hypertension    [facility] Marcine Matar, MD              Passed - Last BP in normal range    BP Readings from Last 1 Encounters:  08/11/20 121/82

## 2021-08-25 ENCOUNTER — Encounter: Payer: Self-pay | Admitting: Internal Medicine

## 2021-08-25 ENCOUNTER — Telehealth: Payer: Self-pay

## 2021-08-25 NOTE — Progress Notes (Signed)
I received lab results done on this patient 08/13/2021 that were ordered by his nephrologist Dr. Sabra Heck. ?Urine showed albumin/creatinine ratio 13. ?BUN 28/creatinine 2.03/GFR 37/K +3.5 ?Hemoglobin 14.4 ?

## 2021-08-25 NOTE — Telephone Encounter (Signed)
Marcine Matar, MD  P [facility] Admin Pool ?Over due for f/u visit for chronic ds management.  Please schedule  ? ?

## 2021-10-04 ENCOUNTER — Other Ambulatory Visit: Payer: Self-pay | Admitting: Internal Medicine

## 2021-10-04 DIAGNOSIS — I10 Essential (primary) hypertension: Secondary | ICD-10-CM

## 2021-10-04 NOTE — Telephone Encounter (Signed)
Copied from [place] [id number]. Topic: Quick Communication - Rx Refill/Question ?>> Oct 04, 2021 11:26 AM Tessa Lerner A wrote: ?Medication: chlorthalidone (HYGROTON) 25 MG tablet [id number]  - patient has 0 ? ?amLODipine (NORVASC) 10 MG tablet [id number] - patient has 0  ? ?carvedilol (COREG) 12.5 MG tablet [id number]  ? ?Has the patient contacted their pharmacy? Yes. The patient was directed to contact their PCP ?(Agent: If no, request that the patient contact the pharmacy for the refill. If patient does not wish to contact the pharmacy document the reason why and proceed with request.) ?(Agent: If yes, when and what did the pharmacy advise?) ? ?Preferred Pharmacy (with phone number or street name): [id number] - [place], [place] ?[place] [address] ?Phone: [phone] Fax: [phone] ?Hours: Not open 24 hours ? ? ?Has the patient been seen for an appointment in the last year OR does the patient have an upcoming appointment? Yes.  ? ?Agent: Please be advised that RX refills may take up to 3 business days. We ask that you follow-up with your pharmacy. ?

## 2021-10-05 MED ORDER — CHLORTHALIDONE 25 MG PO TABS: ORAL_TABLET | ORAL | 1 refills | Status: DC

## 2021-10-05 MED ORDER — CARVEDILOL 12.5 MG PO TABS: 25.0000 mg | ORAL_TABLET | Freq: Two times a day (BID) | ORAL | 1 refills | Status: DC

## 2021-10-05 NOTE — Telephone Encounter (Signed)
Requested Prescriptions  ?Pending Prescriptions Disp Refills  ?? chlorthalidone (HYGROTON) 25 MG tablet 90 tablet 1  ?  ? Cardiovascular: Diuretics - Thiazide Failed - 10/04/2021 12:01 PM  ?  ?  Failed - Cr in normal range and within 180 days  ?  Creatinine, Ser  ?Date Value Ref Range Status  ?08/11/2020 2.07 (H) 0.76 - 1.27 mg/dL Final  ?   ?  ?  Failed - K in normal range and within 180 days  ?  Potassium  ?Date Value Ref Range Status  ?08/11/2020 3.7 3.5 - 5.2 mmol/L Final  ?   ?  ?  Failed - Na in normal range and within 180 days  ?  Sodium  ?Date Value Ref Range Status  ?08/11/2020 142 134 - 144 mmol/L Final  ?   ?  ?  Failed - Valid encounter within last 6 months  ?  Recent Outpatient Visits   ?      ? 1 year ago Essential hypertension  ? [facility] Lois Huxley, Cornelius Moras, RPH-CPP  ? 1 year ago Essential hypertension  ? [facility] Marcine Matar, MD  ? 1 year ago Essential hypertension  ? [facility] Marcine Matar, MD  ? 1 year ago Essential hypertension  ? [facility] Lois Huxley, Cornelius Moras, RPH-CPP  ? 1 year ago Essential hypertension  ? [facility] Marcine Matar, MD  ?  ?  ?Future Appointments   ?        ? In 3 weeks Sharon Seller, [place] [facility]  ?  ? ?  ?  ?  Passed - Last BP in normal range  ?  BP Readings from Last 1 Encounters:  ?08/11/20 121/82  ?   ?  ?  ?? carvedilol (COREG) 12.5 MG tablet 360 tablet 1  ?  Sig: Take 2 tablets (25 mg total) by mouth 2 (two) times daily with a meal.  ?  ? Cardiovascular: Beta Blockers 3 Failed - 10/04/2021 12:01 PM  ?  ?  Failed - Cr in normal range and within 360 days  ?  Creatinine, Ser  ?Date Value Ref Range Status  ?08/11/2020 2.07 (H) 0.76 - 1.27 mg/dL Final  ?   ?  ?  Failed - AST in normal range and within 360 days  ?  AST  ?Date Value Ref Range Status  ?02/27/2020  21 0 - 40 IU/L Final  ?   ?  ?  Failed - ALT in normal range and within 360 days  ?  ALT  ?Date Value Ref Range Status  ?02/27/2020 16 0 - 44 IU/L Final  ?   ?  ?  Failed - Valid encounter within last 6 months  ?  Recent Outpatient Visits   ?      ? 1 year ago Essential hypertension  ? [facility] Lois Huxley, Cornelius Moras, RPH-CPP  ? 1 year ago Essential hypertension  ? [facility] Marcine Matar, MD  ? 1 year ago Essential hypertension  ? [facility] Marcine Matar, MD  ? 1 year ago Essential hypertension  ? [facility] Lois Huxley, Cornelius Moras, RPH-CPP  ? 1 year ago Essential hypertension  ? [facility] Marcine Matar, MD  ?  ?  ?Future Appointments   ?        ? In 3 weeks Sharon Seller, [place] [facility]  ?  ? ?  ?  ?  Passed - Last BP in normal range  ?  BP Readings from Last 1 Encounters:  ?08/11/20 121/82  ?   ?  ?  Passed - Last Heart Rate in normal range  ?  Pulse Readings from Last 1 Encounters:  ?08/11/20 76  ?   ?  ?  ?? amLODipine (NORVASC) 10 MG tablet 90 tablet 2  ?  Sig: Take 1 tablet (10 mg total) by mouth daily.  ?  ? Cardiovascular: Calcium Channel Blockers 2 Failed - 10/04/2021 12:01 PM  ?  ?  Failed - Valid encounter within last 6 months  ?  Recent Outpatient Visits   ?      ? 1 year ago Essential hypertension  ? [facility] Lois Huxley, Cornelius Moras, RPH-CPP  ? 1 year ago Essential hypertension  ? [facility] Marcine Matar, MD  ? 1 year ago Essential hypertension  ? [facility] Marcine Matar, MD  ? 1 year ago Essential hypertension  ? [facility] Lois Huxley, Cornelius Moras, RPH-CPP  ? 1 year ago Essential hypertension  ? [facility] Marcine Matar, MD  ?   ?  ?Future Appointments   ?        ? In 3 weeks Sharon Seller, [place] [facility]  ?  ? ?  ?  ?  Passed - Last BP in normal range  ?  BP Readings from Last 1 Encounters:  ?08/11/20 121/82  ?   ?  ?  Passed - Last Heart Rate in normal range  ?  Pulse Readings from Last 1 Encounters:  ?08/11/20 76  ?   ?  ?  ? ? ?

## 2021-10-05 NOTE — Telephone Encounter (Signed)
Requested medication (s) are due for refill today: no ? ?Requested medication (s) are on the active medication list: no ? ?Last refill:  08/12/20 ? ?Future visit scheduled: yes ? ?Notes to clinic:  Unable to refill per protocol, Rx expired and no longer on active medication list. ? ? ?  ?Requested Prescriptions  ?Pending Prescriptions Disp Refills  ? amLODipine (NORVASC) 10 MG tablet 90 tablet 2  ?  Sig: Take 1 tablet (10 mg total) by mouth daily.  ?  ? Cardiovascular: Calcium Channel Blockers 2 Failed - 10/04/2021 12:01 PM  ?  ?  Failed - Valid encounter within last 6 months  ?  Recent Outpatient Visits   ? ?      ? 1 year ago Essential hypertension  ? [facility] Lois Huxley, Cornelius Moras, RPH-CPP  ? 1 year ago Essential hypertension  ? [facility] Marcine Matar, MD  ? 1 year ago Essential hypertension  ? [facility] Marcine Matar, MD  ? 1 year ago Essential hypertension  ? [facility] Lois Huxley, Cornelius Moras, RPH-CPP  ? 1 year ago Essential hypertension  ? [facility] Marcine Matar, MD  ? ?  ?  ?Future Appointments   ? ?        ? In 3 weeks Sharon Seller, [place] [facility]  ? ?  ? ?  ?  ?  Passed - Last BP in normal range  ?  BP Readings from Last 1 Encounters:  ?08/11/20 121/82  ?  ?  ?  ?  Passed - Last Heart Rate in normal range  ?  Pulse Readings from Last 1 Encounters:  ?08/11/20 76  ?  ?  ?  ?  ?Signed Prescriptions Disp Refills  ? chlorthalidone (HYGROTON) 25 MG tablet 90 tablet 1  ?  Sig: TAKE 1 TABLET BY MOUTH EVERY DAY IN THE MORNING  ?  ? Cardiovascular: Diuretics - Thiazide Failed - 10/04/2021 12:01 PM  ?  ?  Failed - Cr in normal range and within 180 days  ?  Creatinine, Ser  ?Date Value Ref Range Status  ?08/11/2020 2.07 (H) 0.76 - 1.27 mg/dL Final  ?  ?  ?  ?  Failed - K in normal range and within 180 days  ?   Potassium  ?Date Value Ref Range Status  ?08/11/2020 3.7 3.5 - 5.2 mmol/L Final  ?  ?  ?  ?  Failed - Na in normal range and within 180 days  ?  Sodium  ?Date Value Ref Range Status  ?08/11/2020 142 134 - 144 mmol/L Final  ?  ?  ?  ?  Failed - Valid encounter within last 6 months  ?  Recent Outpatient Visits   ? ?      ? 1 year ago Essential hypertension  ? [facility] Lois Huxley, Cornelius Moras, RPH-CPP  ? 1 year ago Essential hypertension  ? [facility] Marcine Matar, MD  ? 1 year ago Essential hypertension  ? [facility] Marcine Matar, MD  ? 1 year ago Essential hypertension  ? [facility] Lois Huxley, Cornelius Moras, RPH-CPP  ? 1 year ago Essential hypertension  ? [facility] [place], Gavin Pound B,  MD  ? ?  ?  ?Future Appointments   ? ?        ? In 3 weeks Sharon Seller, [place] [facility]  ? ?  ? ?  ?  ?  Passed - Last BP in normal range  ?  BP Readings from Last 1 Encounters:  ?08/11/20 121/82  ?  ?  ?  ?  ? carvedilol (COREG) 12.5 MG tablet 360 tablet 1  ?  Sig: Take 2 tablets (25 mg total) by mouth 2 (two) times daily with a meal.  ?  ? Cardiovascular: Beta Blockers 3 Failed - 10/04/2021 12:01 PM  ?  ?  Failed - Cr in normal range and within 360 days  ?  Creatinine, Ser  ?Date Value Ref Range Status  ?08/11/2020 2.07 (H) 0.76 - 1.27 mg/dL Final  ?  ?  ?  ?  Failed - AST in normal range and within 360 days  ?  AST  ?Date Value Ref Range Status  ?02/27/2020 21 0 - 40 IU/L Final  ?  ?  ?  ?  Failed - ALT in normal range and within 360 days  ?  ALT  ?Date Value Ref Range Status  ?02/27/2020 16 0 - 44 IU/L Final  ?  ?  ?  ?  Failed - Valid encounter within last 6 months  ?  Recent Outpatient Visits   ? ?      ? 1 year ago Essential hypertension  ? [facility] Lois Huxley, Cornelius Moras, RPH-CPP  ? 1 year ago  Essential hypertension  ? [facility] Marcine Matar, MD  ? 1 year ago Essential hypertension  ? [facility] Marcine Matar, MD  ? 1 year ago Essential hypertension  ? [facility] Lois Huxley, Cornelius Moras, RPH-CPP  ? 1 year ago Essential hypertension  ? [facility] Marcine Matar, MD  ? ?  ?  ?Future Appointments   ? ?        ? In 3 weeks Sharon Seller, [place] [facility]  ? ?  ? ?  ?  ?  Passed - Last BP in normal range  ?  BP Readings from Last 1 Encounters:  ?08/11/20 121/82  ?  ?  ?  ?  Passed - Last Heart Rate in normal range  ?  Pulse Readings from Last 1 Encounters:  ?08/11/20 76  ?  ?  ?  ?  ? ? ?

## 2021-10-27 ENCOUNTER — Ambulatory Visit: Payer: BC Managed Care – PPO | Attending: Physician Assistant | Admitting: Physician Assistant

## 2021-10-27 ENCOUNTER — Encounter: Payer: Self-pay | Admitting: Physician Assistant

## 2021-10-27 VITALS — BP 129/76 | HR 86 | Wt 195.0 lb

## 2021-10-27 DIAGNOSIS — R739 Hyperglycemia, unspecified: Secondary | ICD-10-CM | POA: Diagnosis not present

## 2021-10-27 DIAGNOSIS — N1832 Chronic kidney disease, stage 3b: Secondary | ICD-10-CM | POA: Diagnosis not present

## 2021-10-27 DIAGNOSIS — I10 Essential (primary) hypertension: Secondary | ICD-10-CM

## 2021-10-27 MED ORDER — CHLORTHALIDONE 25 MG PO TABS: ORAL_TABLET | ORAL | 1 refills | Status: DC

## 2021-10-27 MED ORDER — LISINOPRIL 20 MG PO TABS: 20.0000 mg | ORAL_TABLET | Freq: Every day | ORAL | 1 refills | Status: DC

## 2021-10-27 MED ORDER — CARVEDILOL 12.5 MG PO TABS: 25.0000 mg | ORAL_TABLET | Freq: Two times a day (BID) | ORAL | 1 refills | Status: DC

## 2021-10-27 NOTE — Progress Notes (Signed)
Patient ID: Bryce Sullivan, male   DOB: 11/08/1960, 61 y.o.   MRN: [id number] ? ? ?Bryce Sullivan, is a 61 y.o. male ? ?[id number] ? ?[id number] ? ?DOB - 1961/01/21 ? ?Chief Complaint  ?Patient presents with  ? Medication Refill  ?    ? ?Subjective:  ? ?Bryce Sullivan is a 61 y.o. male here today for med RF.  Sees kidney specialist about 3 times yearly.  Not on dialysis.  Compliant with meds.  No new issues or concerns today.   ? ?No problems updated. ? ?ALLERGIES: ?No Known Allergies ? ?PAST MEDICAL HISTORY: ?Past Medical History:  ?Diagnosis Date  ? Hypertension   ? ? ?MEDICATIONS AT HOME: ?Prior to Admission medications   ?Medication Sig Start Date End Date Taking? Authorizing Provider  ?carvedilol (COREG) 12.5 MG tablet Take 2 tablets (25 mg total) by mouth 2 (two) times daily with a meal. 10/27/21   , Marzella Schlein, PA-C  ?chlorthalidone (HYGROTON) 25 MG tablet TAKE 1 TABLET BY MOUTH EVERY DAY IN THE MORNING 10/27/21   Anders Simmonds, PA-C  ?lisinopril (ZESTRIL) 20 MG tablet Take 1 tablet (20 mg total) by mouth daily. 10/27/21   Anders Simmonds, PA-C  ?metoprolol succinate (TOPROL-XL) 100 MG 24 hr tablet Take 1 tablet (100 mg total) by mouth at bedtime. 04/02/13 02/26/20  [place], Mayer Masker, MD  ?NIFEdipine (PROCARDIA-XL/ADALAT CC) 60 MG 24 hr tablet Take 1 tablet (60 mg total) by mouth daily. 04/02/13 02/26/20  Shon Baton, MD  ? ? ?ROS: ?Neg HEENT ?Neg resp ?Neg cardiac ?Neg GI ?Neg GU ?Neg MS ?Neg psych ?Neg neuro ? ?Objective:  ? ?Vitals:  ? 10/27/21 1357  ?BP: 129/76  ?Pulse: 86  ?SpO2: 97%  ?Weight: 195 lb (88.5 kg)  ? ?Exam ?General appearance : Awake, alert, not in any distress. Speech Clear. Not toxic looking ?HEENT: Atraumatic and Normocephalic ?Neck: Supple, no JVD. No cervical lymphadenopathy.  ?Chest: Good air entry bilaterally, CTAB.  No rales/rhonchi/wheezing ?CVS: S1 S2 regular, no murmurs.  ?Extremities: B/L Lower Ext shows no edema, both legs are warm to touch ?Neurology: Awake alert, and oriented X  3, CN II-XII intact, Non focal ?Skin: No Rash ? ?Data Review ?Lab Results  ?Component Value Date  ? HGBA1C 5.6 03/17/2020  ? ? ?Assessment & Plan  ? ?1. Stage 3b chronic kidney disease (HCC) ?Followed by kidney/nephrology ?- Comprehensive metabolic panel ? ?2. Essential hypertension ?Controlled-no changes ?- Comprehensive metabolic panel ?- lisinopril (ZESTRIL) 20 MG tablet; Take 1 tablet (20 mg total) by mouth daily.  Dispense: 90 tablet; Refill: 1 ?- chlorthalidone (HYGROTON) 25 MG tablet; TAKE 1 TABLET BY MOUTH EVERY DAY IN THE MORNING  Dispense: 90 tablet; Refill: 1 ?- carvedilol (COREG) 12.5 MG tablet; Take 2 tablets (25 mg total) by mouth 2 (two) times daily with a meal.  Dispense: 360 tablet; Refill: 1 ? ? ?3. Hyperglycemia ?- Hemoglobin A1c ? ? ?Return in about 6 months (around 04/29/2022) for PCP for chronic conditions. ? ?The patient was given clear instructions to go to ER or return to medical center if symptoms don't improve, worsen or new problems develop. The patient verbalized understanding. The patient was told to call to get lab results if they haven't heard anything in the next week.  ? ? ? ? ?[place]ian Co, PA-C ?[facility] ?[place], [place] ?[phone]   ?10/27/2021, 2:11 PM  ?

## 2021-10-27 NOTE — Patient Instructions (Signed)
Chronic Kidney Disease, Adult Chronic kidney disease is when lasting damage happens to the kidneys slowly over a long time. The kidneys help to: Make pee (urine). Make hormones. Keep the right amount of fluids and chemicals in the body. Most often, this disease does not go away. You must take steps to help keep the kidney damage from getting worse. If steps are not taken, the kidneys might stop working forever. What are the causes? Diabetes. High blood pressure. Diseases that affect the heart and blood vessels. Other kidney diseases. Diseases of the body's disease-fighting system. A problem with the flow of pee. Infections of the organs that make pee, store it, and take it out of the body. Swelling or irritation of your blood vessels. What increases the risk? Getting older. Having someone in your family who has kidney disease or kidney failure. Having a disease caused by genes. Taking medicines often that harm the kidneys. Being near or having contact with harmful substances. Being very overweight. Using tobacco now or in the past. What are the signs or symptoms? Feeling very tired. Having a swollen face, legs, ankles, or feet. Feeling like you may vomit or vomiting. Not feeling hungry. Being confused or not able to focus. Twitches and cramps in the leg muscles or other muscles. Dry, itchy skin. A taste of metal in your mouth. Making less pee, or making more pee. Shortness of breath. Trouble sleeping. You may also become anemic or get weak bones. Anemic means there is not enough red blood cells or hemoglobin in your blood. You may get symptoms slowly. You may not notice them until the kidney damage gets very bad. How is this treated? Often, there is no cure for this disease. Treatment can help with symptoms and help keep the disease from getting worse. You may need to: Avoid alcohol. Avoid foods that are high in salt, potassium, phosphorous, and protein. Take medicines for  symptoms and to help control other conditions. Have dialysis. This treatment gets harmful waste out of your body. Treat other problems that cause your kidney disease or make it worse. Follow these instructions at home: Medicines Take over-the-counter and prescription medicines only as told by your doctor. Do not take any new medicines, vitamins, or supplements unless your doctor says it is okay. Lifestyle  Do not smoke or use any products that contain nicotine or tobacco. If you need help quitting, ask your doctor. If you drink alcohol: Limit how much you use to: 0-1 drink a day for women who are not pregnant. 0-2 drinks a day for men. Know how much alcohol is in your drink. In the [place] one drink equals one 12 oz bottle of beer (355 mL), one 5 oz glass of wine (148 mL), or one 1 oz glass of hard liquor (44 mL). Stay at a healthy weight. If you need help losing weight, ask your doctor. General instructions  Follow instructions from your doctor about what you cannot eat or drink. Track your blood pressure at home. Tell your doctor about any changes. If you have diabetes, track your blood sugar. Exercise at least 30 minutes a day, 5 days a week. Keep your shots (vaccinations) up to date. Keep all follow-up visits. Where to find more information American Association of Kidney Patients: www.aakp.org National Kidney Foundation: www.kidney.org American Kidney Fund: www.akfinc.org Life Options: www.lifeoptions.org Kidney School: www.kidneyschool.org Contact a doctor if: Your symptoms get worse. You get new symptoms. Get help right away if: You get symptoms of end-stage kidney disease. These   include: Headaches. Losing feeling in your hands or feet. Easy bruising. Having hiccups often. Chest pain. Shortness of breath. Lack of menstrual periods, in women. You have a fever. You make less pee than normal. You have pain or you bleed when you pee or poop. These symptoms may be an  emergency. Get help right away. Call your local emergency services (911 in the [place] Do not wait to see if the symptoms will go away. Do not drive yourself to the hospital. Summary Chronic kidney disease is when lasting damage happens to the kidneys slowly over a long time. Causes of this disease include diabetes and high blood pressure. Often, there is no cure for this disease. Treatment can help symptoms and help keep the disease from getting worse. Treatment may involve lifestyle changes, medicines, and dialysis. This information is not intended to replace advice given to you by your health care provider. Make sure you discuss any questions you have with your health care provider. Document Revised: 09/18/2019 Document Reviewed: 09/18/2019 Elsevier Patient Education  2023 Elsevier Inc.  

## 2021-10-28 LAB — COMPREHENSIVE METABOLIC PANEL
ALT: 17 IU/L (ref 0–44)
AST: 22 IU/L (ref 0–40)
Albumin/Globulin Ratio: 1.3 (ref 1.2–2.2)
Albumin: 4 g/dL (ref 3.8–4.8)
Alkaline Phosphatase: 123 IU/L — ABNORMAL HIGH (ref 44–121)
BUN/Creatinine Ratio: 13 (ref 10–24)
BUN: 27 mg/dL (ref 8–27)
Bilirubin Total: 0.3 mg/dL (ref 0.0–1.2)
CO2: 28 mmol/L (ref 20–29)
Calcium: 9.4 mg/dL (ref 8.6–10.2)
Chloride: 105 mmol/L (ref 96–106)
Creatinine, Ser: 2.05 mg/dL — ABNORMAL HIGH (ref 0.76–1.27)
Globulin, Total: 3 g/dL (ref 1.5–4.5)
Glucose: 133 mg/dL — ABNORMAL HIGH (ref 70–99)
Potassium: 3.7 mmol/L (ref 3.5–5.2)
Sodium: 144 mmol/L (ref 134–144)
Total Protein: 7 g/dL (ref 6.0–8.5)
eGFR: 36 mL/min/{1.73_m2} — ABNORMAL LOW (ref >59)

## 2021-10-28 LAB — HEMOGLOBIN A1C
Est. average glucose Bld gHb Est-mCnc: 117 mg/dL
Hgb A1c MFr Bld: 5.7 % — ABNORMAL HIGH (ref 4.8–5.6)

## 2021-10-29 ENCOUNTER — Encounter: Payer: Self-pay | Admitting: *Deleted

## 2022-04-26 ENCOUNTER — Other Ambulatory Visit: Payer: Self-pay | Admitting: Physician Assistant

## 2022-04-26 DIAGNOSIS — I10 Essential (primary) hypertension: Secondary | ICD-10-CM

## 2022-04-26 NOTE — Telephone Encounter (Signed)
Requested medication (s) are due for refill today: yes  Requested medication (s) are on the active medication list: yes  Last refill:  10/27/21 #90 1 RF  Future visit scheduled: no  Notes to clinic:  called pt and LM on VM to call back to schedule appt.    Requested Prescriptions  Pending Prescriptions Disp Refills   lisinopril (ZESTRIL) 20 MG tablet [Pharmacy Med Name: LISINOPRIL 20 MG TABLET] 90 tablet 1    Sig: TAKE 1 TABLET BY MOUTH EVERY DAY     Cardiovascular:  ACE Inhibitors Failed - 04/26/2022  1:55 AM      Failed - Cr in normal range and within 180 days    Creatinine, Ser  Date Value Ref Range Status  10/27/2021 2.05 (H) 0.76 - 1.27 mg/dL Final         Failed - K in normal range and within 180 days    Potassium  Date Value Ref Range Status  10/27/2021 3.7 3.5 - 5.2 mmol/L Final         Failed - Valid encounter within last 6 months    Recent Outpatient Visits           6 months ago Stage 3b chronic kidney disease [facility])   [place], [place], [place]   1 year ago Essential hypertension   [place], Stephen L, RPH-CPP   1 year ago Essential hypertension   [place], [place]   1 year ago Essential hypertension   [place], [place]   2 years ago Essential hypertension   [place], RPH-CPP              Passed - Patient is not pregnant      Passed - Last BP in normal range    BP Readings from Last 1 Encounters:  10/27/21 129/76

## 2022-05-10 ENCOUNTER — Other Ambulatory Visit: Payer: Self-pay | Admitting: Internal Medicine

## 2022-05-10 DIAGNOSIS — I10 Essential (primary) hypertension: Secondary | ICD-10-CM

## 2022-06-24 ENCOUNTER — Ambulatory Visit: Payer: BC Managed Care – PPO | Attending: Internal Medicine | Admitting: Internal Medicine

## 2022-06-24 ENCOUNTER — Encounter: Payer: Self-pay | Admitting: Internal Medicine

## 2022-06-24 VITALS — BP 150/96 | HR 96 | Temp 98.4°F | Ht 71.0 in | Wt 198.0 lb

## 2022-06-24 DIAGNOSIS — Z125 Encounter for screening for malignant neoplasm of prostate: Secondary | ICD-10-CM | POA: Diagnosis not present

## 2022-06-24 DIAGNOSIS — E7800 Pure hypercholesterolemia, unspecified: Secondary | ICD-10-CM | POA: Diagnosis not present

## 2022-06-24 DIAGNOSIS — N1832 Chronic kidney disease, stage 3b: Secondary | ICD-10-CM

## 2022-06-24 DIAGNOSIS — Z2821 Immunization not carried out because of patient refusal: Secondary | ICD-10-CM

## 2022-06-24 DIAGNOSIS — I10 Essential (primary) hypertension: Secondary | ICD-10-CM | POA: Diagnosis not present

## 2022-06-24 MED ORDER — AMLODIPINE BESYLATE 10 MG PO TABS: 10.0000 mg | ORAL_TABLET | Freq: Every day | ORAL | 1 refills | Status: DC

## 2022-06-24 MED ORDER — CARVEDILOL 25 MG PO TABS: 25.0000 mg | ORAL_TABLET | Freq: Two times a day (BID) | ORAL | 1 refills | Status: DC

## 2022-06-24 NOTE — Progress Notes (Signed)
Patient ID: Bryce Sullivan, male    DOB: [birth year]  MRN: [id number]  CC: Hypertension (Htn f/u. Issues with getting med refills./No to flu vax.)   Subjective: Bryce Sullivan is a 61 y.o. male who presents for chronic ds management.  I last had visit with him 07/2020 His concerns today include:  Pt with hx of HTN, epistaxis, HL, CKD 3.   HYPERTENSION/CKD 3 Currently taking: see medication list.  Reports being on Norvasc 10 mg daily and Coreg 25 mg BID.  Reports Chlorthalidone and Lisinopril were D/C by nephrology.  Out of meds.  Last saw neph 01/2022.  Not on NSAIDs Med Adherence: [x]  Yes  -out of Coreg x 2 wks Medication side effects: []  Yes    [x]  No Adherence with salt restriction: [x]  Yes    []  No Home Monitoring?: [x]  Yes but not recently Monitoring Frequency:  Home BP results range:  SOB? []  Yes    [x]  No Chest Pain?: []  Yes    [x]  No Leg swelling?: []  Yes    [x]  No Headaches?: []  Yes    [x]  No Dizziness? []  Yes    [x]  No Comments: history of high cholesterol.  Not on meds  HM:  declines flu shot.  Declines shingles vaccine. 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 Take 1 tablet by mouth     carvedilol (COREG) 12.5 MG tablet Take 2 tablets (25 mg total) by mouth 2 (two) times daily with a meal. 360 tablet 1   chlorthalidone (HYGROTON) 25 MG tablet TAKE 1 TABLET BY MOUTH EVERY DAY IN THE MORNING (Patient not taking: Reported on 06/24/2022) 90 tablet 1   lisinopril (ZESTRIL) 20 MG tablet TAKE 1  TABLET BY MOUTH EVERY DAY (Patient not taking: Reported on 06/24/2022) 30 tablet 0   [DISCONTINUED] metoprolol succinate (TOPROL-XL) 100 MG 24 hr tablet Take 1 tablet (100 mg total) by mouth at bedtime. 30 tablet 0   [DISCONTINUED] NIFEdipine (PROCARDIA-XL/ADALAT CC) 60 MG 24 hr tablet Take 1 tablet (60 mg total) by mouth daily. 30 tablet 0   No current facility-administered medications on file prior to visit.    No Known Allergies  Social History   Socioeconomic History   Marital status: Significant Other    Spouse name: Not on file   Number of children: 3   Years of education: Not on file   Highest education level: 12th grade  Occupational History   Not on file  Tobacco Use   Smoking status: Never   Smokeless tobacco: Never  Vaping Use   Vaping Use: Never used  Substance and Sexual Activity   Alcohol use: Not on file    Comment: occasionally   Drug use: No   Sexual activity: Not on file  Other Topics Concern   Not on file  Social History Narrative   Not on file   Social Determinants of Health   Financial Resource Strain: Not on file  Food Insecurity: Not on file  Transportation Needs: Not on file  Physical Activity: Not on  file  Stress: Not on file  Social Connections: Not on file  Intimate Partner Violence: Not on file    Family History  Problem Relation Age of Onset   Hypertension Mother    Hypertension Father    Hypertension Sister    Diabetes Sister    Hypertension Brother    Diabetes Brother     Past Surgical History:  Procedure Laterality Date   NO PAST SURGERIES      ROS: Review of Systems Negative except as stated above  PHYSICAL EXAM: BP (!) 157/96 (BP Location: Left Arm, Patient Position: Sitting, Cuff Size: Normal)   Pulse 96   Temp 98.4 F (36.9 C) (Oral)   Ht 5\' 11"  (1.803 m)   Wt 198 lb (89.8 kg)   SpO2 98%   BMI 27.62 kg/m   Physical Exam   General appearance - alert, well appearing, older AAM and in no distress Mental  status - normal mood, behavior, speech, dress, motor activity, and thought processes Neck - supple, no significant adenopathy Chest - clear to auscultation, no wheezes, rales or rhonchi, symmetric air entry Heart - normal rate, regular rhythm, normal S1, S2, no murmurs, rubs, clicks or gallops Extremities - peripheral pulses normal, no pedal edema, no clubbing or cyanosis     Latest Ref Rng & Units 10/27/2021    2:16 PM 08/11/2020    9:11 AM 02/27/2020    2:01 PM  CMP  Glucose 70 - 99 mg/dL 04/28/2020  196  222   BUN 8 - 27 mg/dL 27  38  19   Creatinine 0.76 - 1.27 mg/dL 979  8.92  1.19   Sodium 134 - 144 mmol/L 144  142  140   Potassium 3.5 - 5.2 mmol/L 3.7  3.7  3.6   Chloride 96 - 106 mmol/L 105  100  99   CO2 20 - 29 mmol/L 28  23  27    Calcium 8.6 - 10.2 mg/dL 9.4  9.0  9.3   Total Protein 6.0 - 8.5 g/dL 7.0   7.0   Total Bilirubin 0.0 - 1.2 mg/dL 0.3   0.6   Alkaline Phos 44 - 121 IU/L 123   102   AST 0 - 40 IU/L 22   21   ALT 0 - 44 IU/L 17   16    Lipid Panel     Component Value Date/Time   CHOL 179 08/11/2020 0911   TRIG 111 08/11/2020 0911   HDL 57 08/11/2020 0911   CHOLHDL 3.1 08/11/2020 0911   LDLCALC 102 (H) 08/11/2020 0911    CBC    Component Value Date/Time   WBC 5.5 02/27/2020 1401   WBC 11.2 (H) 09/06/2016 0513   RBC 5.57 02/27/2020 1401   RBC 5.38 09/06/2016 0513   HGB 15.6 02/27/2020 1401   HCT 47.7 02/27/2020 1401   PLT 235 02/27/2020 1401   MCV 86 02/27/2020 1401   MCH 28.0 02/27/2020 1401   MCH 29.2 09/06/2016 0513   MCHC 32.7 02/27/2020 1401   MCHC 34.0 09/06/2016 0513   RDW 12.6 02/27/2020 1401   LYMPHSABS 1.1 03/27/2010 1251   MONOABS 0.2 03/27/2010 1251   EOSABS 0.0 03/27/2010 1251   BASOSABS 0.0 03/27/2010 1251    ASSESSMENT AND PLAN:  1. Essential hypertension Not at goal and out of meds. RF sent on Coreg and Norvasc Check BP 2 x/wk and record readings F/u with clinical pharmacist in 3 wks for recheck - amLODipine (NORVASC) 10 MG  tablet; Take 1 tablet (10 mg total) by mouth daily. Take 1 tablet by mouth  Dispense: 90 tablet; Refill: 1 - carvedilol (COREG) 25 MG tablet; Take 1 tablet (25 mg total) by mouth 2 (two) times daily with a meal.  Dispense: 180 tablet; Refill: 1 - CBC - Comprehensive metabolic panel  2. Pure hypercholesterolemia - Lipid panel  3. Prostate cancer screening Agreeable to screening with PSA level - PSA  4. Influenza vaccination declined   5.  CKD 3b Stable.  Followed by nephrology.  Avoid NSAIDS  Patient was given the opportunity to ask questions.  Patient verbalized understanding of the plan and was able to repeat key elements of the plan.   This documentation was completed using Radio producer.  Any transcriptional errors are unintentional.  No orders of the defined types were placed in this encounter.    Requested Prescriptions    No prescriptions requested or ordered in this encounter    No follow-ups on file.  Karle Plumber, MD, FACP

## 2022-06-25 ENCOUNTER — Telehealth: Payer: Self-pay | Admitting: Internal Medicine

## 2022-06-25 DIAGNOSIS — R972 Elevated prostate specific antigen [PSA]: Secondary | ICD-10-CM

## 2022-06-25 LAB — CBC
Hematocrit: 45 % (ref 37.5–51.0)
Hemoglobin: 14.9 g/dL (ref 13.0–17.7)
MCH: 28.5 pg (ref 26.6–33.0)
MCHC: 33.1 g/dL (ref 31.5–35.7)
MCV: 86 fL (ref 79–97)
Platelets: 201 10*3/uL (ref 150–450)
RBC: 5.22 x10E6/uL (ref 4.14–5.80)
RDW: 12.5 % (ref 11.6–15.4)
WBC: 4.2 10*3/uL (ref 3.4–10.8)

## 2022-06-25 LAB — COMPREHENSIVE METABOLIC PANEL
ALT: 20 IU/L (ref 0–44)
AST: 31 IU/L (ref 0–40)
Albumin/Globulin Ratio: 1.4 (ref 1.2–2.2)
Albumin: 4.3 g/dL (ref 3.9–4.9)
Alkaline Phosphatase: 119 IU/L (ref 44–121)
BUN/Creatinine Ratio: 10 (ref 10–24)
BUN: 17 mg/dL (ref 8–27)
Bilirubin Total: 0.7 mg/dL (ref 0.0–1.2)
CO2: 25 mmol/L (ref 20–29)
Calcium: 9 mg/dL (ref 8.6–10.2)
Chloride: 99 mmol/L (ref 96–106)
Creatinine, Ser: 1.67 mg/dL — ABNORMAL HIGH (ref 0.76–1.27)
Globulin, Total: 3.1 g/dL (ref 1.5–4.5)
Glucose: 97 mg/dL (ref 70–99)
Potassium: 3.9 mmol/L (ref 3.5–5.2)
Sodium: 139 mmol/L (ref 134–144)
Total Protein: 7.4 g/dL (ref 6.0–8.5)
eGFR: 46 mL/min/{1.73_m2} — ABNORMAL LOW (ref >59)

## 2022-06-25 LAB — LIPID PANEL
Chol/HDL Ratio: 2.3 ratio (ref 0.0–5.0)
Cholesterol, Total: 179 mg/dL (ref 100–199)
HDL: 78 mg/dL (ref >39)
LDL Chol Calc (NIH): 85 mg/dL (ref 0–99)
Triglycerides: 86 mg/dL (ref 0–149)
VLDL Cholesterol Cal: 16 mg/dL (ref 5–40)

## 2022-06-25 LAB — PSA: Prostate Specific Ag, Serum: 16.9 ng/mL — ABNORMAL HIGH (ref 0.0–4.0)

## 2022-06-25 NOTE — Telephone Encounter (Signed)
PC placed to pt this evening to go over lab results.  Pt informed that his PSA level is elevated and abnormal.  I advise referral to a urologist to have this evaluated further to check for any underlying prostate CA.  Pt agreeable to this.  I told him that if he does not receive a call for appt within 3 wks, he should call me back to let me know.  He expressed understanding Kidney function not 100% but slightly improved compared to when last checked by [place] 8 mths ago.  Cholesterol levels good.  Results for orders placed or performed in visit on 06/24/22  CBC  Result Value Ref Range   WBC 4.2 3.4 - 10.8 x10E3/uL   RBC 5.22 4.14 - 5.80 x10E6/uL   Hemoglobin 14.9 13.0 - 17.7 g/dL   Hematocrit 45.0 37.5 - 51.0 %   MCV 86 79 - 97 fL   MCH 28.5 26.6 - 33.0 pg   MCHC 33.1 31.5 - 35.7 g/dL   RDW 12.5 11.6 - 15.4 %   Platelets 201 150 - 450 x10E3/uL  Comprehensive metabolic panel  Result Value Ref Range   Glucose 97 70 - 99 mg/dL   BUN 17 8 - 27 mg/dL   Creatinine, Ser 1.67 (H) 0.76 - 1.27 mg/dL   eGFR 46 (L) >59 mL/min/1.73   BUN/Creatinine Ratio 10 10 - 24   Sodium 139 134 - 144 mmol/L   Potassium 3.9 3.5 - 5.2 mmol/L   Chloride 99 96 - 106 mmol/L   CO2 25 20 - 29 mmol/L   Calcium 9.0 8.6 - 10.2 mg/dL   Total Protein 7.4 6.0 - 8.5 g/dL   Albumin 4.3 3.9 - 4.9 g/dL   Globulin, Total 3.1 1.5 - 4.5 g/dL   Albumin/Globulin Ratio 1.4 1.2 - 2.2   Bilirubin Total 0.7 0.0 - 1.2 mg/dL   Alkaline Phosphatase 119 44 - 121 IU/L   AST 31 0 - 40 IU/L   ALT 20 0 - 44 IU/L  Lipid panel  Result Value Ref Range   Cholesterol, Total 179 100 - 199 mg/dL   Triglycerides 86 0 - 149 mg/dL   HDL 78 >39 mg/dL   VLDL Cholesterol Cal 16 5 - 40 mg/dL   LDL Chol Calc (NIH) 85 0 - 99 mg/dL   Chol/HDL Ratio 2.3 0.0 - 5.0 ratio  PSA  Result Value Ref Range   Prostate Specific Ag, Serum 16.9 (H) 0.0 - 4.0 ng/mL

## 2022-08-01 ENCOUNTER — Ambulatory Visit: Payer: BC Managed Care – PPO | Admitting: Pharmacist

## 2022-08-26 ENCOUNTER — Encounter: Payer: Self-pay | Admitting: Pharmacist

## 2022-08-26 ENCOUNTER — Ambulatory Visit: Payer: BC Managed Care – PPO | Attending: Internal Medicine | Admitting: Pharmacist

## 2022-08-26 VITALS — BP 137/84 | HR 79

## 2022-08-26 DIAGNOSIS — I10 Essential (primary) hypertension: Secondary | ICD-10-CM

## 2022-08-26 NOTE — Progress Notes (Signed)
   PCP: Dr. Wynetta Emery  S:    Patient presents to the clinic for hypertension evaluation, counseling, and management. Patient was referred and last seen by Primary Care Provider on 06/24/22. At that visit, BP was 150/96 mmHg.  Of note, he told us at that appt that Nephrology discontinued his chlorthalidone and lisinopril ~8/23. Additionally, he admitted to being out of carvedilol ~2 weeks prior to that appt.   Today, pt arrives in good spirits. He reports doing well today and has no complaints. He denies any chest pains, dyspnea, blurred vision, or HA.   Medication adherence reported. He has taken his amlodipine and AM dose of carvedilol today already.   Current BP Medications include: amlodipine 10 mg daily, carvedilol 25 mg BID  Dietary habits include: - endorses compliance with salt restriction  - denies any excessive caffeine intake  Exercise habits include:  - no formal exercise regimen reported   Family / Social history: - Fhx: significant for HTN, DM - never smoker - 1 alcholic beverage per night  Home BP readings:  -Reports 120s/70s. No meter/cuff with him today.   O:  Vitals:   08/26/22 1421  BP: 137/84  Pulse: 79    Last 3 Office BP readings: BP Readings from Last 3 Encounters:  08/26/22 137/84  06/24/22 (!) 150/96  10/27/21 129/76   BMET    Component Value Date/Time   NA 139 06/24/2022 0926   K 3.9 06/24/2022 0926   CL 99 06/24/2022 0926   CO2 25 06/24/2022 0926   GLUCOSE 97 06/24/2022 0926   GLUCOSE 190 (H) 09/06/2016 0513   BUN 17 06/24/2022 0926   CREATININE 1.67 (H) 06/24/2022 0926   CALCIUM 9.0 06/24/2022 0926   GFRNONAA 34 (L) 08/11/2020 0911   GFRAA 39 (L) 08/11/2020 0911   Renal function: CrCl cannot be calculated (Patient's most recent lab result is older than the maximum 21 days allowed.).  Clinical ASCVD: NO The ASCVD Risk score (Arnett DK, et al., 2019) failed to calculate for the following reasons:   Unable to determine if patient is  Non-Hispanic African American  A/P: Hypertension longstanding currently close to goal on current medications. BP Goal = < 130/80 mmHg. Medication adherence reported. No changes today given his improvement and acceptable reported home BP readings.  - Continue carvedilol '25mg'$  BID - Continue amlodipine 10 mg daily - Counseled on lifestyle modifications for blood pressure control including reduced dietary sodium, increased exercise, adequate sleep. - Recommend he check and record BP at home   Results reviewed and written information provided.   Total time in face-to-face counseling 30 minutes. F/u appt with Dr. Wynetta Emery in April.   Benard Halsted, PharmD, [place], [place] [phone]

## 2022-09-27 DIAGNOSIS — N1832 Chronic kidney disease, stage 3b: Secondary | ICD-10-CM | POA: Diagnosis not present

## 2022-09-28 LAB — LAB REPORT - SCANNED
Albumin, Urine POC: 74.6
Creatinine, POC: 428.1 mg/dL
EGFR: 48
Microalb Creat Ratio: 17

## 2022-10-17 ENCOUNTER — Ambulatory Visit: Payer: 59 | Attending: Internal Medicine | Admitting: Internal Medicine

## 2022-10-17 ENCOUNTER — Encounter: Payer: Self-pay | Admitting: Internal Medicine

## 2022-10-17 VITALS — BP 128/80 | HR 77 | Temp 98.3°F | Ht 71.0 in | Wt 187.0 lb

## 2022-10-17 DIAGNOSIS — N1832 Chronic kidney disease, stage 3b: Secondary | ICD-10-CM | POA: Diagnosis not present

## 2022-10-17 DIAGNOSIS — R972 Elevated prostate specific antigen [PSA]: Secondary | ICD-10-CM | POA: Diagnosis not present

## 2022-10-17 DIAGNOSIS — I10 Essential (primary) hypertension: Secondary | ICD-10-CM | POA: Diagnosis not present

## 2022-10-17 NOTE — Progress Notes (Signed)
Patient ID: Bryce Sullivan, male    DOB: 20-May-1961  MRN: [id number]  CC: Hypertension (HTN f/u. )   Subjective: Bryce Sullivan is a 62 y.o. male who presents for chronic disease management.  Wife, Alvis Lemmings, is with  him. His concerns today include:  Pt with hx of HTN, epistaxis, HL, CKD 3.   HYPERTENSION/CKD 3 Currently taking: see medication list.  Reports being on Norvasc 10 mg daily and Coreg 25 mg BID; compliant.  Limits salt No CP/SOB/HA/dizziness  CKD 3b: saw nephrology 2 wks ago.  Had blood test done.  Still making good urine.   PSA was 16 on last visit.  [facility].  Patient states he was never called.  No problems passing his urine.  No nocturia. Patient Active Problem List   Diagnosis Date Noted   Stage 3b chronic kidney disease 03/17/2020   Pure hypercholesterolemia 03/17/2020   Hyperglycemia 03/17/2020   Influenza vaccination declined 03/17/2020   COVID-19 virus vaccination declined 03/17/2020   Essential hypertension 02/27/2020   Epistaxis 02/27/2020     Current Outpatient Medications on File Prior to Visit  Medication Sig Dispense Refill   amLODipine (NORVASC) 10 MG tablet Take 1 tablet (10 mg total) by mouth daily. Take 1 tablet by mouth 90 tablet 1   carvedilol (COREG) 25 MG tablet Take 1 tablet (25 mg total) by mouth 2 (two) times daily with a meal. 180 tablet 1   [DISCONTINUED] metoprolol succinate (TOPROL-XL) 100 MG 24 hr tablet Take 1 tablet (100 mg total) by mouth at bedtime. 30 tablet 0   [DISCONTINUED] NIFEdipine (PROCARDIA-XL/ADALAT CC) 60 MG 24 hr tablet Take 1 tablet (60 mg total) by mouth daily. 30 tablet 0   No current facility-administered medications on file prior to visit.    No Known Allergies  Social History   Socioeconomic History   Marital status: Significant Other    Spouse name: Not on file   Number of children: 3   Years of education: Not on file   Highest education level: 12th grade  Occupational History   Not on file   Tobacco Use   Smoking status: Never   Smokeless tobacco: Never  Vaping Use   Vaping Use: Never used  Substance and Sexual Activity   Alcohol use: Not on file    Comment: occasionally   Drug use: No   Sexual activity: Not on file  Other Topics Concern   Not on file  Social History Narrative   Not on file   Social Determinants of Health   Financial Resource Strain: Not on file  Food Insecurity: Not on file  Transportation Needs: Not on file  Physical Activity: Not on file  Stress: Not on file  Social Connections: Not on file  Intimate Partner Violence: Not on file    Family History  Problem Relation Age of Onset   Hypertension Mother    Hypertension Father    Hypertension Sister    Diabetes Sister    Hypertension Brother    Diabetes Brother     Past Surgical History:  Procedure Laterality Date   NO PAST SURGERIES      ROS: Review of Systems Negative except as stated above  PHYSICAL EXAM: BP 128/80 (BP Location: Left Arm, Patient Position: Sitting, Cuff Size: Normal)   Pulse 77   Temp 98.3 F (36.8 C) (Oral)   Ht  (1.803 m)   Wt 187 lb (84.8 kg)   SpO2 98%   BMI 26.08  kg/m   Physical Exam   General appearance - alert, well appearing, older African-American male and in no distress Mental status - normal mood, behavior, speech, dress, motor activity, and thought processes Neck - supple, no significant adenopathy Chest - clear to auscultation, no wheezes, rales or rhonchi, symmetric air entry Heart - normal rate, regular rhythm, normal S1, S2, no murmurs, rubs, clicks or gallops Extremities - peripheral pulses normal, no pedal edema, no clubbing or cyanosis     Latest Ref Rng & Units 06/24/2022    9:26 AM 10/27/2021    2:16 PM 08/11/2020    9:11 AM  CMP  Glucose 70 - 99 mg/dL 97  440  347   BUN 8 - 27 mg/dL 17  27  38   Creatinine 0.76 - 1.27 mg/dL 4.25  9.56  3.87   Sodium 134 - 144 mmol/L 139  144  142   Potassium 3.5 - 5.2 mmol/L 3.9  3.7   3.7   Chloride 96 - 106 mmol/L 99  105  100   CO2 20 - 29 mmol/L Calcium 8.6 - 10.2 mg/dL 9.0  9.4  9.0   Total Protein 6.0 - 8.5 g/dL 7.4  7.0    Total Bilirubin 0.0 - 1.2 mg/dL 0.7  0.3    Alkaline Phos 44 - 121 IU/L 119  123    AST 0 - 40 IU/L 31  22    ALT 0 - 44 IU/L 20  17     Lipid Panel     Component Value Date/Time   CHOL 179 06/24/2022 0926   TRIG 86 06/24/2022 0926   HDL 78 06/24/2022 0926   CHOLHDL 2.3 06/24/2022 0926   LDLCALC 85 06/24/2022 0926    CBC    Component Value Date/Time   WBC 4.2 06/24/2022 0926   WBC 11.2 (H) 09/06/2016 0513   RBC 5.22 06/24/2022 0926   RBC 5.38 09/06/2016 0513   HGB 14.9 06/24/2022 0926   HCT 45.0 06/24/2022 0926   PLT 201 06/24/2022 0926   MCV 86 06/24/2022 0926   MCH 28.5 06/24/2022 0926   MCH 29.2 09/06/2016 0513   MCHC 33.1 06/24/2022 0926   MCHC 34.0 09/06/2016 0513   RDW 12.5 06/24/2022 0926   LYMPHSABS 1.1 03/27/2010 1251   MONOABS 0.2 03/27/2010 1251   EOSABS 0.0 03/27/2010 1251   BASOSABS 0.0 03/27/2010 1251    ASSESSMENT AND PLAN:  1. Essential hypertension At goal.  Continue amlodipine 10 mg daily and carvedilol 25 mg twice a day  2. Stage 3b chronic kidney disease Stable.  Followed by nephrology.  3. Elevated PSA Message sent to referral coordinator requesting that she check into appointment with [facility] for follow-up on this.  I also printed the information for [facility] and gave to patient so that he can also call them to request an appointment.    Patient was given the opportunity to ask questions.  Patient verbalized understanding of the plan and was able to repeat key elements of the plan.   This documentation was completed using Paediatric nurse.  Any transcriptional errors are unintentional.  No orders of the defined types were placed in this encounter.    Requested Prescriptions    No prescriptions requested or ordered in this encounter     Return in about 4 months (around 02/16/2023).  Bryce Blue, MD, FACP

## 2022-10-17 NOTE — Patient Instructions (Signed)
[facility] ph. # [phone].Address [address] [place] [address].   Please call them to schedule an appointment for follow up on elevated PSA level of 16.

## 2022-10-19 ENCOUNTER — Telehealth: Payer: Self-pay | Admitting: Internal Medicine

## 2022-10-19 DIAGNOSIS — R972 Elevated prostate specific antigen [PSA]: Secondary | ICD-10-CM

## 2022-10-19 NOTE — Telephone Encounter (Signed)
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  Patient states he was never called.  Please facilitate getting him an appointment.

## 2022-10-19 NOTE — Telephone Encounter (Signed)
-----   Message from Dionne Bucy sent at 10/19/2022  1:59 PM EDT ----- Regarding: RE: Urology Referral Good Afternoon Referral    Placed in [facility] [address] [place], [place] [address] Ph# [phone]   ----- Message ----- From: Marcine Matar, MD Sent: 10/19/2022  10:17 AM EDT To: Dionne Bucy Subject: RE: Urology Referral                           Done. 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  Patient states he was never called.  Please facilitate getting him an appointment.

## 2022-10-24 ENCOUNTER — Ambulatory Visit: Payer: Self-pay | Admitting: Internal Medicine

## 2022-10-27 ENCOUNTER — Encounter: Payer: 59 | Admitting: Urology

## 2022-10-28 ENCOUNTER — Encounter: Payer: Self-pay | Admitting: Nephrology

## 2022-11-04 IMAGING — US US RENAL
1 series · 14 of 25 positions shown · non-contrast
Comparison: None.

CLINICAL DATA: Kidney disease

EXAM:
RENAL / URINARY TRACT ULTRASOUND COMPLETE

[Series 1: us renal · 0.26mm/px · 14 of 40 slices shown]
[im 1/40]
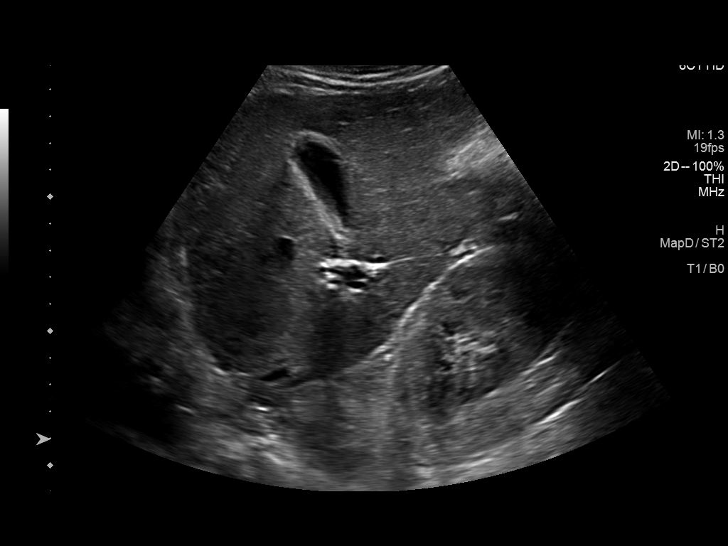
[im 4/40]
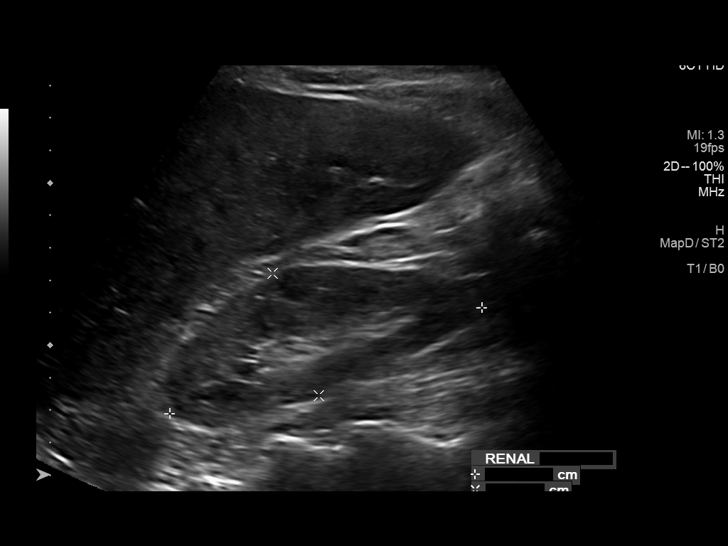
[im 7/40]
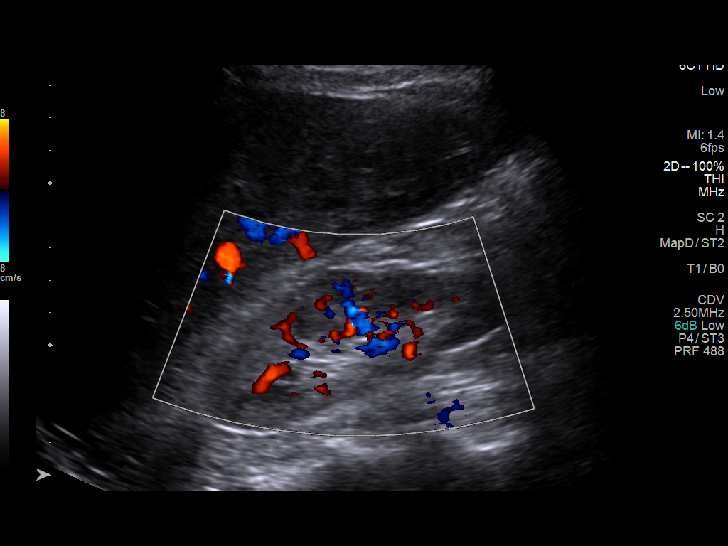
[im 10/40]
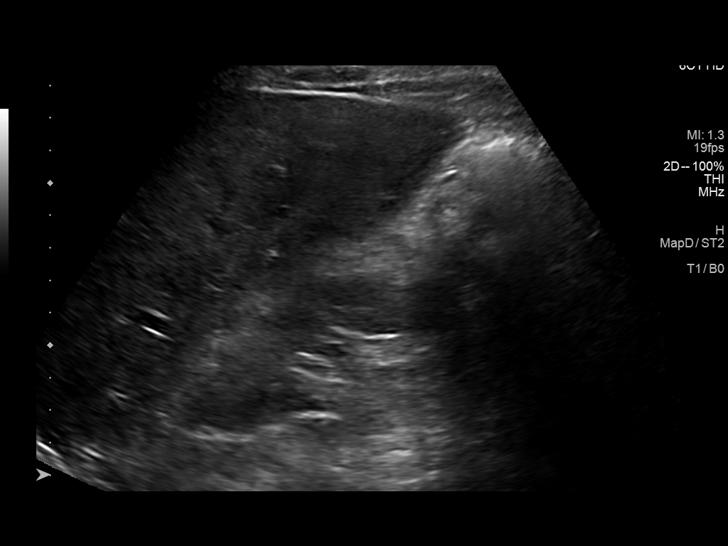
[im 14/40]
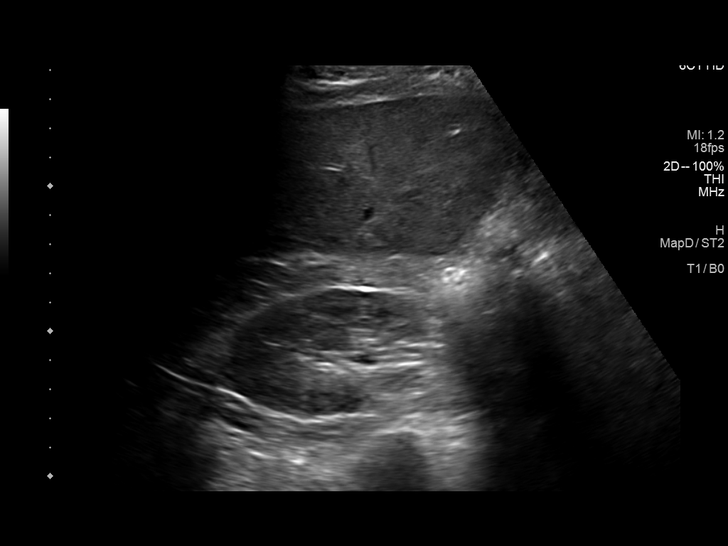
[im 15/40]
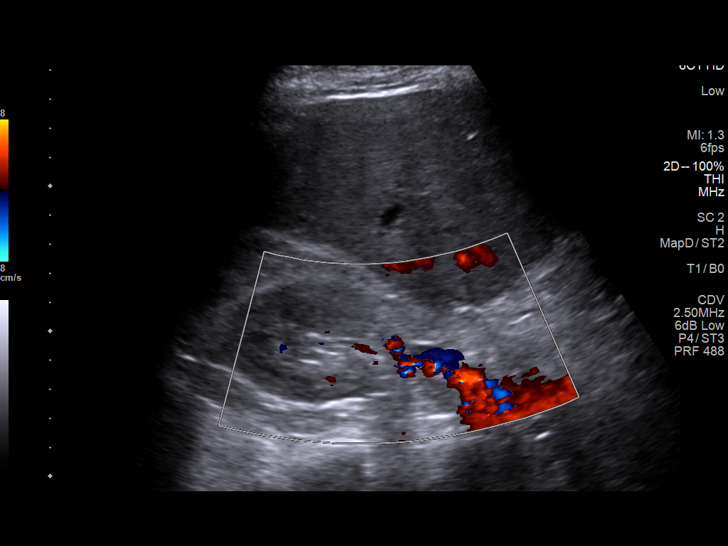
[im 18/40]
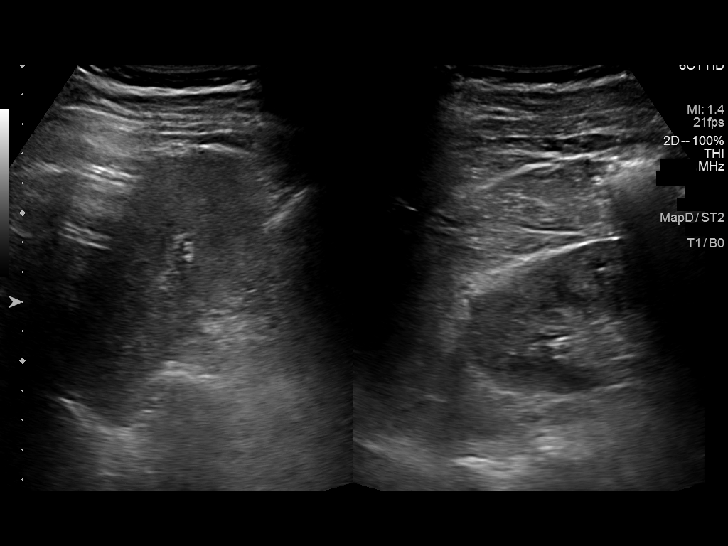
[im 22/40]
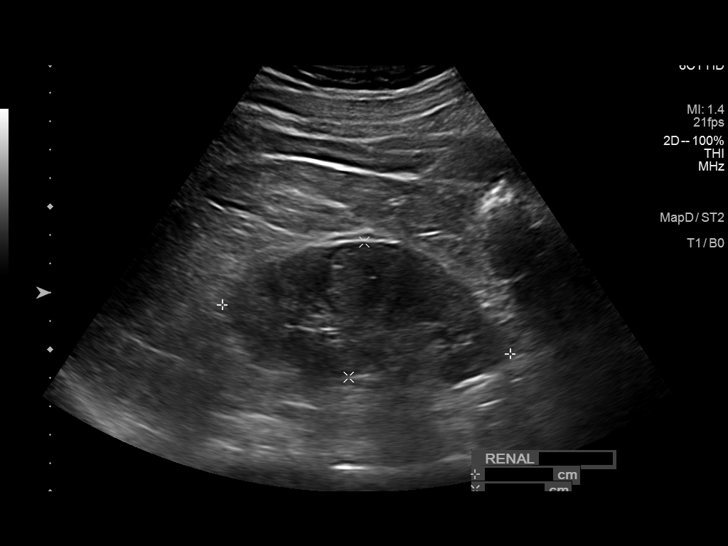
[im 25/40]
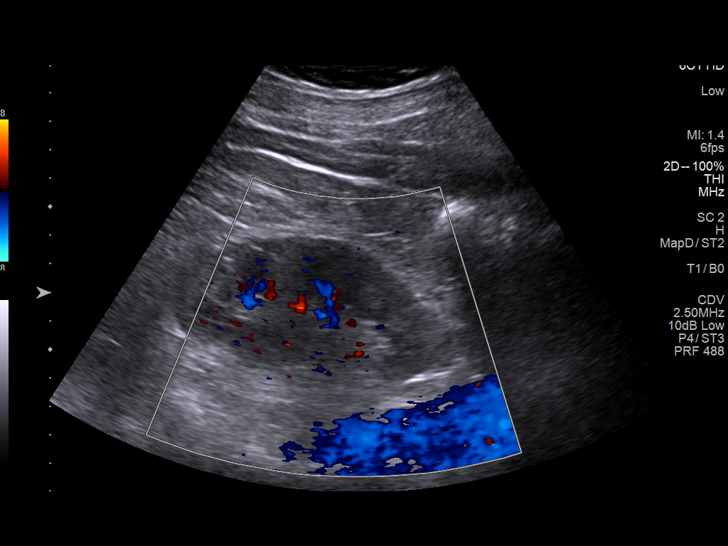
[im 27/40]
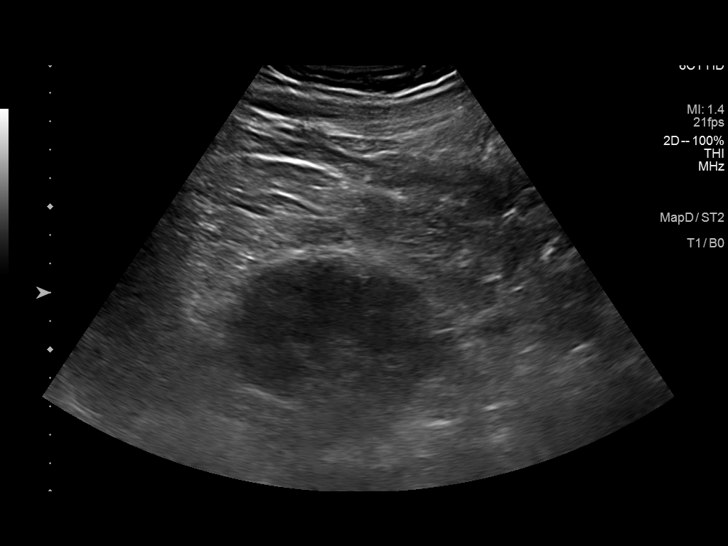
[im 30/40]
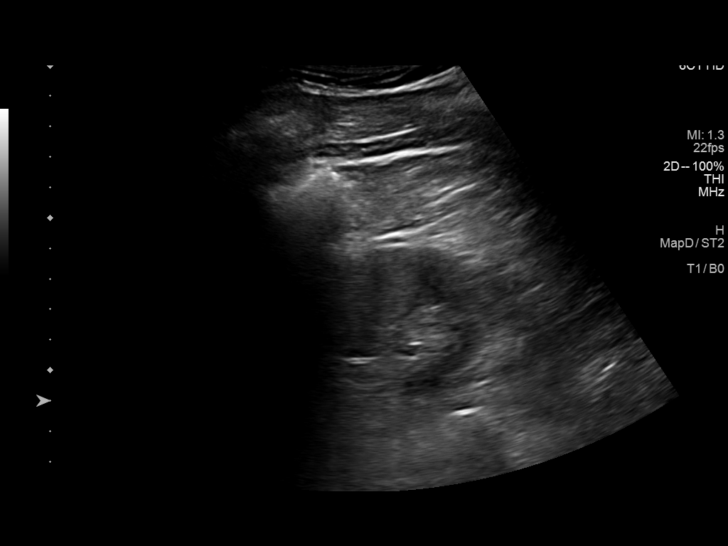
[im 33/40]
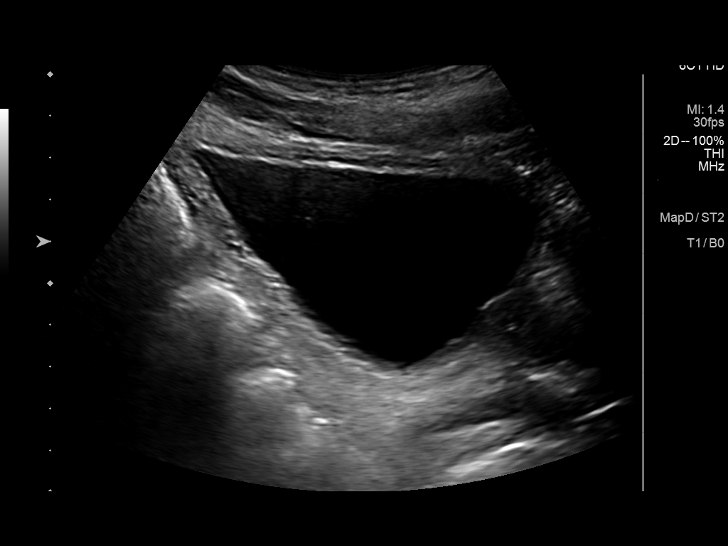
[im 36/40]
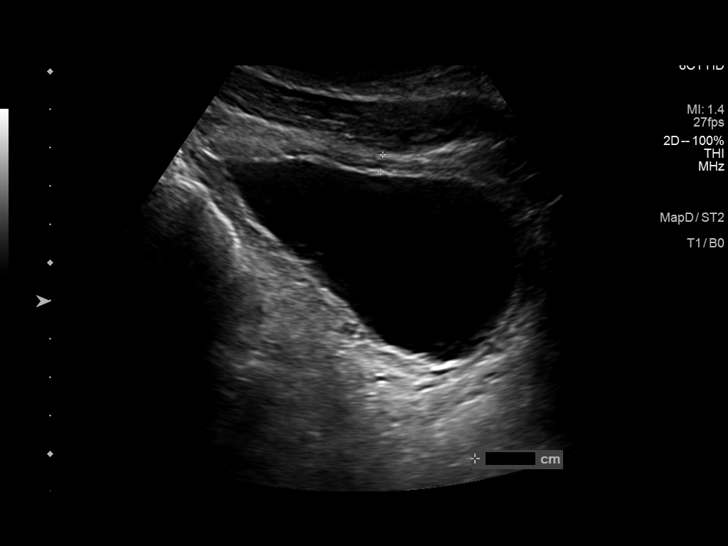
[im 40/40]
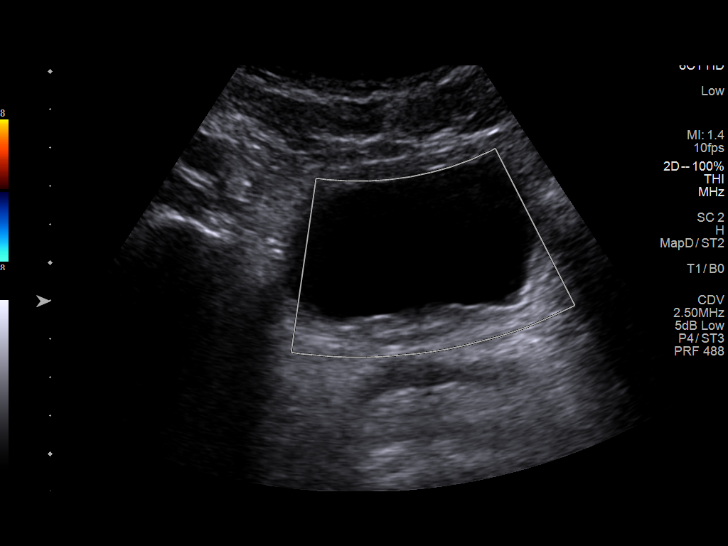

[14 of 25 positions shown; findings below may reference images not displayed]

FINDINGS: Right Kidney:

Renal measurements: 10.2 x 4 x 5.6 cm = volume: 120 mL. Echogenicity
within normal limits. No mass or hydronephrosis visualized.

Left Kidney:

Renal measurements: 10.2 x 4.8 x 4.5 cm = volume: 115 mL.
Echogenicity within normal limits. No mass or hydronephrosis
visualized.

Bladder:

Appears normal for degree of bladder distention.

Other:

None.
IMPRESSION: Unremarkable exam.

## 2022-11-11 DIAGNOSIS — R972 Elevated prostate specific antigen [PSA]: Secondary | ICD-10-CM | POA: Diagnosis not present

## 2022-12-17 ENCOUNTER — Other Ambulatory Visit: Payer: Self-pay | Admitting: Internal Medicine

## 2022-12-17 DIAGNOSIS — I10 Essential (primary) hypertension: Secondary | ICD-10-CM

## 2022-12-19 NOTE — Telephone Encounter (Signed)
Requested Prescriptions  Pending Prescriptions Disp Refills   carvedilol (COREG) 25 MG tablet [Pharmacy Med Name: Carvedilol 25 MG Oral Tablet] 180 tablet 0    Sig: TAKE 1 TABLET BY MOUTH TWICE DAILY WITH A MEAL     Cardiovascular: Beta Blockers 3 Passed - 12/17/2022  6:50 AM      Passed - Cr in normal range and within 360 days    Creatinine, Ser  Date Value Ref Range Status  06/24/2022 1.67 (H) 0.76 - 1.27 mg/dL Final   Creatinine, POC  Date Value Ref Range Status  09/28/2022 428.1 mg/dL Final         Passed - AST in normal range and within 360 days    AST  Date Value Ref Range Status  06/24/2022 31 0 - 40 IU/L Final         Passed - ALT in normal range and within 360 days    ALT  Date Value Ref Range Status  06/24/2022 20 0 - 44 IU/L Final         Passed - Last BP in normal range    BP Readings from Last 1 Encounters:  10/17/22 128/80         Passed - Last Heart Rate in normal range    Pulse Readings from Last 1 Encounters:  10/17/22 77         Passed - Valid encounter within last 6 months    Recent Outpatient Visits           2 months ago Essential hypertension   [place] [facility] Marcine Matar, MD   3 months ago Essential hypertension   [facility] [place], Cornelius Moras, RPH-CPP   5 months ago Essential hypertension   [place] [facility] Marcine Matar, MD   1 year ago Stage 3b chronic kidney disease [facility])   [place] [facility] [place], [place], [place]   2 years ago Essential hypertension   [facility] [facility] [place], Cornelius Moras, RPH-CPP       Future Appointments             In 1 month Laural Benes, Binnie Rail, MD [facility]

## 2022-12-21 DIAGNOSIS — R972 Elevated prostate specific antigen [PSA]: Secondary | ICD-10-CM | POA: Diagnosis not present

## 2022-12-28 DIAGNOSIS — C61 Malignant neoplasm of prostate: Secondary | ICD-10-CM | POA: Diagnosis not present

## 2023-01-02 ENCOUNTER — Telehealth: Payer: Self-pay | Admitting: Internal Medicine

## 2023-01-02 NOTE — Telephone Encounter (Signed)
Routing to scheduler

## 2023-01-02 NOTE — Telephone Encounter (Signed)
Pt's wife is calling in because pt was recently diagnosed with prostate cancer and they want a second opinion. Mrs. Capizzi says they went to [facility] the first time and would like to be referred to another location. Please advise.

## 2023-02-02 ENCOUNTER — Telehealth: Payer: Self-pay

## 2023-02-02 NOTE — Telephone Encounter (Signed)
I called pt to introduce myself as the Coordinator of the [facility]state [facility].   1. I confirmed with the patient he is aware of his referral to the clinic 8/27, arriving @ 12:30 pm.    2. I discussed the format of the clinic and the physicians he will be seeing that day.   3. I discussed where the clinic is located and how to contact me.   4. I confirmed his address and informed him I would be mailing a packet of information and forms to be completed. I asked him to bring them with him the day of his appointment.    He voiced understanding of the above. I asked him to call me if he has any questions or concerns regarding his appointments or the forms he needs to complete.

## 2023-02-16 ENCOUNTER — Ambulatory Visit: Payer: 59 | Attending: Internal Medicine | Admitting: Internal Medicine

## 2023-02-16 ENCOUNTER — Encounter: Payer: Self-pay | Admitting: Internal Medicine

## 2023-02-16 VITALS — BP 128/82 | HR 72 | Temp 98.2°F | Ht 71.0 in | Wt 191.0 lb

## 2023-02-16 DIAGNOSIS — I10 Essential (primary) hypertension: Secondary | ICD-10-CM

## 2023-02-16 DIAGNOSIS — Z2821 Immunization not carried out because of patient refusal: Secondary | ICD-10-CM | POA: Diagnosis not present

## 2023-02-16 DIAGNOSIS — R7303 Prediabetes: Secondary | ICD-10-CM | POA: Diagnosis not present

## 2023-02-16 DIAGNOSIS — N1831 Chronic kidney disease, stage 3a: Secondary | ICD-10-CM | POA: Diagnosis not present

## 2023-02-16 DIAGNOSIS — Z28310 Unvaccinated for covid-19: Secondary | ICD-10-CM

## 2023-02-16 DIAGNOSIS — C61 Malignant neoplasm of prostate: Secondary | ICD-10-CM | POA: Diagnosis not present

## 2023-02-16 MED ORDER — CARVEDILOL 25 MG PO TABS
25.0000 mg | ORAL_TABLET | Freq: Two times a day (BID) | ORAL | 0 refills | Status: DC
Start: 2023-02-16 — End: 2023-02-16

## 2023-02-16 MED ORDER — AMLODIPINE BESYLATE 10 MG PO TABS: 10.0000 mg | ORAL_TABLET | Freq: Every day | ORAL | 1 refills | Status: DC

## 2023-02-16 MED ORDER — CARVEDILOL 25 MG PO TABS
25.0000 mg | ORAL_TABLET | Freq: Two times a day (BID) | ORAL | 1 refills | Status: DC
Start: 2023-02-16 — End: 2023-06-19

## 2023-02-16 NOTE — Progress Notes (Signed)
Patient ID: Bryce Sullivan, male    DOB: 03-28-1961  MRN: [id number]  CC: Hypertension (HTN f/u. Med refills./No questions / concerns.)   Subjective: Bryce Sullivan is a 62 y.o. male who presents for chronic disease management. Wife is with him His concerns today include:  Pt with hx of HTN, epistaxis, HL, CKD 3, prostate CA, PreDM  Referred to urology on last visit for elevated PSA.  Saw Dr. Samuella Cota.  Had biopsy.  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  Has f/u appt 02/21/2023 to discuss treatment options  HYPERTENSION/CKD 3 Currently taking: see medication list.  Reports being on Norvasc 10 mg daily and Coreg 25 mg BID; compliant.  Took already this a.m.  Checks BP 1-2x/wk.  Gives range 127-130s/79-86. Limits salt No CP/SOB/HA/dizziness Last GFR was 46 in December. PreDM: History of prediabetes with last A1c of 5.7 done May 2023.  Overall wife states that he does okay with his eating habits.  HM:  declines flu shot and COVID-19 series.   Patient Active Problem List   Diagnosis Date Noted   Prostate cancer (HCC) 02/16/2023   Stage 3b chronic kidney disease (HCC) 03/17/2020   Pure hypercholesterolemia 03/17/2020   Hyperglycemia 03/17/2020   Influenza vaccination declined 03/17/2020   COVID-19 vaccine series declined 03/17/2020   Essential hypertension 02/27/2020   Epistaxis 02/27/2020     Current Outpatient Medications on File Prior to Visit  Medication Sig Dispense Refill   [DISCONTINUED] metoprolol succinate (TOPROL-XL) 100 MG 24 hr tablet Take 1 tablet (100 mg total) by mouth at bedtime. 30 tablet 0   [DISCONTINUED] NIFEdipine (PROCARDIA-XL/ADALAT CC) 60 MG 24 hr tablet Take 1 tablet (60 mg total) by mouth daily. 30 tablet 0   No current facility-administered medications on file prior to visit.    No Known Allergies  Social History   Socioeconomic History   Marital status: Significant Other    Spouse name: Not on file   Number of children: 3   Years of education: Not on  file   Highest education level: 12th grade  Occupational History   Not on file  Tobacco Use   Smoking status: Never   Smokeless tobacco: Never  Vaping Use   Vaping status: Never Used  Substance and Sexual Activity   Alcohol use: Not on file    Comment: occasionally   Drug use: No   Sexual activity: Not on file  Other Topics Concern   Not on file  Social History Narrative   Not on file   Social Determinants of Health   Financial Resource Strain: Not on file  Food Insecurity: Not on file  Transportation Needs: Not on file  Physical Activity: Not on file  Stress: Not on file  Social Connections: Not on file  Intimate Partner Violence: Not on file    Family History  Problem Relation Age of Onset   Hypertension Mother    Hypertension Father    Hypertension Sister    Diabetes Sister    Hypertension Brother    Diabetes Brother     Past Surgical History:  Procedure Laterality Date   NO PAST SURGERIES      ROS: Review of Systems Negative except as stated above  PHYSICAL EXAM: BP 128/82   Pulse 72   Temp 98.2 F (36.8 C) (Oral)   Ht 5\' 11"  (1.803 m)   Wt 191 lb (86.6 kg)   SpO2 97%   BMI 26.64 kg/m   Physical Exam   General  appearance - alert, well appearing, and in no distress Mental status - normal mood, behavior, speech, dress, motor activity, and thought processes Neck - supple, no significant adenopathy Chest - clear to auscultation, no wheezes, rales or rhonchi, symmetric air entry Heart - normal rate, regular rhythm, normal S1, S2, no murmurs, rubs, clicks or gallops Extremities - peripheral pulses normal, no pedal edema, no clubbing or cyanosis     Latest Ref Rng & Units 06/24/2022    9:26 AM 10/27/2021    2:16 PM 08/11/2020    9:11 AM  CMP  Glucose 70 - 99 mg/dL 97  409  811   BUN 8 - 27 mg/dL 17  27  38   Creatinine 0.76 - 1.27 mg/dL 9.14  7.82  9.56   Sodium 134 - 144 mmol/L 139  144  142   Potassium 3.5 - 5.2 mmol/L 3.9  3.7  3.7    Chloride 96 - 106 mmol/L 99  105  100   CO2 20 - 29 mmol/L 25  28  23    Calcium 8.6 - 10.2 mg/dL 9.0  9.4  9.0   Total Protein 6.0 - 8.5 g/dL 7.4  7.0    Total Bilirubin 0.0 - 1.2 mg/dL 0.7  0.3    Alkaline Phos 44 - 121 IU/L 119  123    AST 0 - 40 IU/L 31  22    ALT 0 - 44 IU/L 20  17     Lipid Panel     Component Value Date/Time   CHOL 179 06/24/2022 0926   TRIG 86 06/24/2022 0926   HDL 78 06/24/2022 0926   CHOLHDL 2.3 06/24/2022 0926   LDLCALC 85 06/24/2022 0926    CBC    Component Value Date/Time   WBC 4.2 06/24/2022 0926   WBC 11.2 (H) 09/06/2016 0513   RBC 5.22 06/24/2022 0926   RBC 5.38 09/06/2016 0513   HGB 14.9 06/24/2022 0926   HCT 45.0 06/24/2022 0926   PLT 201 06/24/2022 0926   MCV 86 06/24/2022 0926   MCH 28.5 06/24/2022 0926   MCH 29.2 09/06/2016 0513   MCHC 33.1 06/24/2022 0926   MCHC 34.0 09/06/2016 0513   RDW 12.5 06/24/2022 0926   LYMPHSABS 1.1 03/27/2010 1251   MONOABS 0.2 03/27/2010 1251   EOSABS 0.0 03/27/2010 1251   BASOSABS 0.0 03/27/2010 1251    ASSESSMENT AND PLAN: 1. Essential hypertension Repeat blood pressure closer to goal today.  Continue amlodipine and carvedilol.  He currently has a wrist device for home blood pressure check.  Recommend trying to get an automated arm device instead. - amLODipine (NORVASC) 10 MG tablet; Take 1 tablet (10 mg total) by mouth daily. Take 1 tablet by mouth  Dispense: 90 tablet; Refill: 1 - carvedilol (COREG) 25 MG tablet; Take 1 tablet (25 mg total) by mouth 2 (two) times daily with a meal.  Dispense: 180 tablet; Refill: 1  2. Stage 3a chronic kidney disease (HCC) Recheck GFR today.  Not on NSAIDs.  Continue to monitor. - Basic Metabolic Panel  3. Prostate cancer [facility]) Keep upcoming appointment with urology to discuss management options.  4. Prediabetes Patient advised to eliminate sugary drinks from the diet, cut back on portion sizes especially of white carbohydrates, eat more white lean meat like  chicken [place] and seafood instead of beef or pork and incorporate fresh fruits and vegetables into the diet daily.  - Hemoglobin A1c  5. Influenza vaccination declined Recommended.  Patient declined.  6. COVID-19 vaccine series declined Recommended.  Patient declined.   Patient was given the opportunity to ask questions.  Patient verbalized understanding of the plan and was able to repeat key elements of the plan.   This documentation was completed using Paediatric nurse.  Any transcriptional errors are unintentional.  Orders Placed This Encounter  Procedures   Hemoglobin A1c   Basic Metabolic Panel     Requested Prescriptions   Signed Prescriptions Disp Refills   amLODipine (NORVASC) 10 MG tablet 90 tablet 1    Sig: Take 1 tablet (10 mg total) by mouth daily. Take 1 tablet by mouth   carvedilol (COREG) 25 MG tablet 180 tablet 1    Sig: Take 1 tablet (25 mg total) by mouth 2 (two) times daily with a meal.    Return in about 4 months (around 06/18/2023).  Jonah Blue, MD, FACP

## 2023-02-17 LAB — HEMOGLOBIN A1C
Est. average glucose Bld gHb Est-mCnc: 114 mg/dL
Hgb A1c MFr Bld: 5.6 % (ref 4.8–5.6)

## 2023-02-17 LAB — BASIC METABOLIC PANEL
BUN/Creatinine Ratio: 11 (ref 10–24)
BUN: 17 mg/dL (ref 8–27)
CO2: 26 mmol/L (ref 20–29)
Calcium: 8.7 mg/dL (ref 8.6–10.2)
Chloride: 103 mmol/L (ref 96–106)
Creatinine, Ser: 1.52 mg/dL — ABNORMAL HIGH (ref 0.76–1.27)
Glucose: 96 mg/dL (ref 70–99)
Potassium: 3.6 mmol/L (ref 3.5–5.2)
Sodium: 142 mmol/L (ref 134–144)
eGFR: 51 mL/min/{1.73_m2} — ABNORMAL LOW (ref >59)

## 2023-02-20 NOTE — Progress Notes (Signed)
RN spoke with patient and confirmed upcoming [facility] appointment.

## 2023-02-21 ENCOUNTER — Ambulatory Visit
Admission: RE | Admit: 2023-02-21 | Discharge: 2023-02-21 | Disposition: A | Discharge status: Home / Self Care | Payer: 59 | Source: Ambulatory Visit | Attending: Radiation Oncology | Admitting: Radiation Oncology

## 2023-02-21 ENCOUNTER — Encounter: Payer: Self-pay | Admitting: Radiation Oncology

## 2023-02-21 ENCOUNTER — Inpatient Hospital Stay: Payer: 59 | Attending: Radiation Oncology | Admitting: Genetic Counselor

## 2023-02-21 ENCOUNTER — Other Ambulatory Visit: Payer: Self-pay

## 2023-02-21 VITALS — BP 134/87 | HR 74 | Temp 97.9°F | Resp 20 | Ht 71.0 in | Wt 189.6 lb

## 2023-02-21 DIAGNOSIS — Z8042 Family history of malignant neoplasm of prostate: Secondary | ICD-10-CM

## 2023-02-21 DIAGNOSIS — Z191 Hormone sensitive malignancy status: Secondary | ICD-10-CM | POA: Diagnosis not present

## 2023-02-21 DIAGNOSIS — C61 Malignant neoplasm of prostate: Secondary | ICD-10-CM

## 2023-02-21 HISTORY — DX: Elevated prostate specific antigen (PSA): R97.20

## 2023-02-21 NOTE — Progress Notes (Signed)
Radiation Oncology         [phone] ________________________________  Multidisciplinary Prostate Cancer Clinic  Initial Radiation Oncology Consultation  Name: Bryce Sullivan MRN: [id number]  Date: 02/21/2023  DOB: 06-Oct-1960  BM:[id number], Binnie Rail, MD  Noel Christmas, MD   REFERRING PHYSICIAN: Noel Christmas, MD  DIAGNOSIS: 62 y.o. gentleman with stage T2a adenocarcinoma of the prostate with a Gleason's score of 3+4 and a PSA of 10.1    ICD-10-CM   1. Prostate cancer (HCC)  C61       HISTORY OF PRESENT ILLNESS::Bryce Sullivan is a 62 y.o. gentleman.  He was noted to have an elevated PSA of 16.9 by his primary care physician, Dr. Laural Benes.  Accordingly, he was referred for evaluation in urology by Dr. Arita Miss on 11/11/22. Digital rectal examination was performed at that time revealing a small right nodule, though Dr. Arita Miss notes difficulty palpating the entire prostate. A repeat PSA obtained that day had decreased but remained elevated at 10.1.  Therefore, the patient proceeded to transrectal ultrasound with 12 biopsies of the prostate on 12/21/22.  The prostate volume measured 24.09 cc.  Out of 12 core biopsies, 8 were positive.  The maximum Gleason score was 3+4, and this was seen in the left base lateral, left mid lateral, left base, left mid, right base, and right base lateral. Additionally, Gleason 3+3 was seen in the right mid lateral and right mid.      The patient reviewed the biopsy results with his urologist and he has kindly been referred today to the multidisciplinary prostate cancer clinic for presentation of pathology and radiology studies in our conference for discussion of potential radiation treatment options and clinical evaluation.   PREVIOUS RADIATION THERAPY: No  PAST MEDICAL HISTORY:  has a past medical history of Elevated PSA and Hypertension.    PAST SURGICAL HISTORY: Past Surgical History:  Procedure Laterality Date   PROSTATE BIOPSY      FAMILY HISTORY:  family history includes Diabetes in his brother and sister; Hypertension in his brother, father, mother, and sister.  SOCIAL HISTORY:  reports that he has never smoked. He has never used smokeless tobacco. He reports current alcohol use. He reports that he does not use drugs.  ALLERGIES: Patient has no known allergies.  MEDICATIONS:  Current Outpatient Medications  Medication Sig Dispense Refill   amLODipine (NORVASC) 10 MG tablet Take 1 tablet (10 mg total) by mouth daily. Take 1 tablet by mouth 90 tablet 1   carvedilol (COREG) 25 MG tablet Take 1 tablet (25 mg total) by mouth 2 (two) times daily with a meal. 180 tablet 1   No current facility-administered medications for this encounter.    REVIEW OF SYSTEMS:  On review of systems, the patient reports that he is doing well overall. He denies any chest pain, shortness of breath, cough, fevers, chills, night sweats, unintended weight changes. He denies any bowel disturbances, and denies abdominal pain, nausea or vomiting. He denies any new musculoskeletal or joint aches or pains. His IPSS was 8, indicating mild to moderate urinary symptoms. His SHIM was 25, indicating he does not have erectile dysfunction. A complete review of systems is obtained and is otherwise negative.   PHYSICAL EXAM:  Wt Readings from Last 3 Encounters:  02/21/23 189 lb 9.6 oz (86 kg)  02/16/23 191 lb (86.6 kg)  10/17/22 187 lb (84.8 kg)   Temp Readings from Last 3 Encounters:  02/21/23 97.9 F (36.6 C)  02/16/23 98.2 F (36.8  C) (Oral)  10/17/22 98.3 F (36.8 C) (Oral)   BP Readings from Last 3 Encounters:  02/21/23 134/87  02/16/23 128/82  10/17/22 128/80   Pulse Readings from Last 3 Encounters:  02/21/23 74  02/16/23 72  10/17/22 77    /10  In general this is a well appearing African-American man in no acute distress. He's alert and oriented x4 and appropriate throughout the examination. Cardiopulmonary assessment is negative for acute distress and  he exhibits normal effort.    KPS = 100  100 - Normal; no complaints; no evidence of disease. 90   - Able to carry on normal activity; minor signs or symptoms of disease. 80   - Normal activity with effort; some signs or symptoms of disease. 97   - Cares for self; unable to carry on normal activity or to do active work. 60   - Requires occasional assistance, but is able to care for most of his personal needs. 50   - Requires considerable assistance and frequent medical care. 40   - Disabled; requires special care and assistance. 30   - Severely disabled; hospital admission is indicated although death not imminent. 20   - Very sick; hospital admission necessary; active supportive treatment necessary. 10   - Moribund; fatal processes progressing rapidly. 0     - Dead  Karnofsky DA, Abelmann WH, Craver LS and [facility] [phone]) The use of the nitrogen mustards in the palliative treatment of carcinoma: with particular reference to bronchogenic carcinoma Cancer 1 [id number]   LABORATORY DATA:  Lab Results  Component Value Date   WBC 4.2 06/24/2022   HGB 14.9 06/24/2022   HCT 45.0 06/24/2022   MCV 86 06/24/2022   PLT 201 06/24/2022   Lab Results  Component Value Date   NA 142 02/16/2023   K 3.6 02/16/2023   CL 103 02/16/2023   CO2 26 02/16/2023   Lab Results  Component Value Date   ALT 20 06/24/2022   AST 31 06/24/2022   ALKPHOS 119 06/24/2022   BILITOT 0.7 06/24/2022     RADIOGRAPHY: No results found.    IMPRESSION/PLAN: 62 y.o. gentleman with Stage T2a adenocarcinoma of the prostate with a Gleason score of 3+4 and a PSA of 10.1.    We discussed the patient's workup and outlined the nature of prostate cancer in this setting. The patient's T stage, Gleason's score, and PSA put him into the favorable intermediate risk group. Accordingly, he is eligible for a variety of potential treatment options including brachytherapy, 5.5 weeks of external radiation, or prostatectomy. We  discussed the available radiation techniques, and focused on the details and logistics of delivery. We discussed and outlined the risks, benefits, short and long-term effects associated with radiotherapy and compared and contrasted these with prostatectomy. We discussed the role of SpaceOAR gel in reducing the rectal toxicity associated with radiotherapy. He appears to have a good understanding of his disease and our treatment recommendations which are of curative intent.  He was encouraged to ask questions that were answered to his/their stated satisfaction.  At the end of the conversation the patient is undecided and would like to take some additional time to consider the options of brachytherapy versus 5-1/2 weeks of daily external beam radiation.  He has our contact information and will let us know once he reaches a final decision so that we can proceed with treatment planning accordingly at that time.  We enjoyed meeting him today and look forward to continuing to  participate in his care.  We personally spent 60 minutes in this encounter including chart review, reviewing radiological studies, meeting face-to-face with the patient, entering orders and completing documentation.    Marguarite Arbour, PA-C    Margaretmary Dys, MD  [facility]  Radiation Oncology Direct Dial: [phone]  Fax: [phone] [email or web address]  Skype  [email or web address]   This document serves as a record of services personally performed by Margaretmary Dys, MD and Marcello Fennel, PA-C. It was created on their behalf by Mickie Bail, a trained medical scribe. The creation of this record is based on the scribe's personal observations and the provider's statements to them. This document has been checked and approved by the attending provider.

## 2023-02-21 NOTE — Consult Note (Signed)
Multi-Disciplinary Clinic     02/21/2023   --------------------------------------------------------------------------------   Bryce Sullivan  MRN: [id number]  DOB: [birth year], 62 year old Male  SSN:    PRIMARY CARE:     REFERRING:  MaryEllen D. Arita Miss, MD  PROVIDER:  Kasandra Knudsen, M.D.  TREATING:  Heloise Purpura, M.D.  LOCATION:  [facility] [phone]     --------------------------------------------------------------------------------   CC/HPI: CC: Prostate Cancer   Physician requesting consult: Dr. Kasandra Knudsen  PCP: Dr. Jonah Blue  Location of consult: [facility] - Prostate Cancer Multidisciplinary Clinic   Mr. Wilbourne is a 62 year old gentleman who was found to have an elevated PSA of 10.1 and a small right sided prostate nodule. He underwent a TRUS biopsy of the prostate on 12/21/22 that confirmed Gleason 3+4=7 adenocarcinoma with 8 out of 12 biopsy cores positive for malignancy.   Family history: None.   Imaging studies: None.   PMH: He has a history of hypertension. He has also been recently diagnosed with stage III chronic kidney disease.  PSH: No abdominal surgeries.   TNM stage: cT2a Nx Mx  PSA: 10.1  Gleason score: 3+4=7 (GG 2)  Biopsy (12/21/22): 8/12 cores positive  Left: L lateral mid (60%, 3+4=7), L mid (10%, 3+4=7), L lateral base (40%, 3+4=7), L base (60%, 3+4=7, PNI)  Right: R mid (80%, 3+3=6), R lateral mid (95%, 3+3=6, PNI), R base (95%, 3+4=7, PNI), R lateral base (95%, 3+4=7, PNI)  Prostate volume: 24.1 cc   Nomogram  OC disease: 41%  EPE: 57%  SVI: 9%  LNI: 9%  PFS (5 year, 10 year): 72%, 58%   Urinary function: IPSS is 8.  Erectile function: SHIM score is 25.     ALLERGIES: No Allergies    MEDICATIONS: Amlodipine Besylate 10 mg tablet  Carvedilol 25 mg tablet     GU PSH: Prostate Needle Biopsy - 12/21/2022     NON-GU PSH: Surgical Pathology, Gross And Microscopic Examination For Prostate Needle -  12/21/2022     GU PMH: Prostate Cancer - 12/28/2022 Elevated PSA - 12/21/2022, - 11/11/2022    NON-GU PMH: Hypertension    FAMILY HISTORY: 2 sons - Son   SOCIAL HISTORY: Marital Status: Married Preferred Language: English; Ethnicity: Not Hispanic Or Latino; Race: Black or African American Current Smoking Status: Patient has never smoked.   Tobacco Use Assessment Completed: Used Tobacco in last 30 days? Does drink.  Does not drink caffeine. Patient's occupation Advertising account planner.    REVIEW OF SYSTEMS:    GU Review Male:   Patient denies frequent urination, hard to postpone urination, burning/ pain with urination, get up at night to urinate, leakage of urine, stream starts and stops, trouble starting your streams, and have to strain to urinate .  Gastrointestinal (Lower):   Patient denies diarrhea and constipation.  Gastrointestinal (Upper):   Patient denies nausea and vomiting.  Constitutional:   Patient denies fever, night sweats, weight loss, and fatigue.  Skin:   Patient denies skin rash/ lesion and itching.  Eyes:   Patient denies blurred vision and double vision.  Ears/ Nose/ Throat:   Patient denies sore throat and sinus problems.  Hematologic/Lymphatic:   Patient denies swollen glands and easy bruising.  Cardiovascular:   Patient denies leg swelling and chest pains.  Respiratory:   Patient denies cough and shortness of breath.  Endocrine:   Patient denies excessive thirst.  Musculoskeletal:   Patient denies back pain and joint pain.  Neurological:   Patient denies headaches and dizziness.  Psychologic:   Patient denies depression and anxiety.   VITAL SIGNS: None   MULTI-SYSTEM PHYSICAL EXAMINATION:    Constitutional: Well-nourished. No physical deformities. Normally developed. Good grooming.     Complexity of Data:  Lab Test Review:   PSA  Records Review:   Pathology Reports, Previous Patient Records   11/11/22  PSA  Total PSA 10.10 ng/mL    PROCEDURES: None    ASSESSMENT:      ICD-10 Details  1 GU:   Prostate Cancer - C61    PLAN:           Document Letter(s):  Created for Patient: Clinical Summary         Notes:   1. Unfavorable intermediate risk prostate cancer: I had a detailed discussion with Mr. Overgaard today regarding his prostate cancer situation. The patient was counseled about the natural history of prostate cancer and the standard treatment options that are available for prostate cancer. It was explained to him how his age and life expectancy, clinical stage, Gleason score/prognostic grade group, and PSA (and PSA density) affect his prognosis, the decision to proceed with additional staging studies, as well as how that information influences recommended treatment strategies. We discussed the roles for active surveillance, radiation therapy, surgical therapy, androgen deprivation, as well as ablative therapy and other investigational options for the treatment of prostate cancer as appropriate to his individual cancer situation. We discussed the risks and benefits of these options with regard to their impact on cancer control and also in terms of potential adverse events, complications, and impact on quality of life particularly related to urinary and sexual function. The patient was encouraged to ask questions throughout the discussion today and all questions were answered to his stated satisfaction. In addition, the patient was provided with and/or directed to appropriate resources and literature for further education about prostate cancer and treatment options.   After our discussion today, he is adamant that he does not wish to proceed with surgical therapy. He is scheduled to meet with Dr. Kathrynn Running later this afternoon and is interested in proceeding with primary radiation therapy.   CC: Dr. Kasandra Knudsen  Dr. Jonah Blue  Dr. Margaretmary Dys    E & M CODES: We spent 42 minutes dedicated to evaluation and management time, including  face to face interaction, discussions on coordination of care, documentation, result review, and discussion with others as applicable.

## 2023-02-21 NOTE — Progress Notes (Signed)
                               Care Plan Summary  Name: Bryce Sullivan   DOB: 07-23-1960   Your Medical Team:   Urologist -  Dr. Heloise Purpura, [facility]  Radiation Oncologist - Dr. Margaretmary Dys, [facility]     Recommendations: 1) Daily Radiation  2) Brachytherapy (Seed Implant)   * These recommendations are based on information available as of today's consult.      Recommendations may change depending on the results of further tests or exams.    Next Steps: 1) Consider all your options and contact [name], your nurse navigator with any questions or treatment decisions.     When appointments need to be scheduled, you will be contacted by [facility] and/or [facility].  Questions?  Please do not hesitate to call Cherlyn Cushing, BSN, RN at [phone] with any questions or concerns.  [name] is your Oncology Nurse Navigator and is available to assist you while you're receiving your medical care at [facility].

## 2023-02-23 ENCOUNTER — Encounter: Payer: Self-pay | Admitting: Genetic Counselor

## 2023-02-23 NOTE — Progress Notes (Signed)
REFERRING PROVIDER: Margaretmary Dys, MD  PRIMARY PROVIDER:  Marcine Matar, MD  PRIMARY REASON FOR VISIT:  1. Prostate cancer (HCC)   2. Family history of prostate cancer    HISTORY OF PRESENT ILLNESS:   Bryce Sullivan, a 62 y.o. male, was seen for a [facility] cancer genetics consultation at the request of Dr. Kathrynn Running due to a personal and family history of cancer.  Bryce Sullivan presents to clinic today to discuss the possibility of a hereditary predisposition to cancer, to discuss genetic testing, and to further clarify his future cancer risks, as well as potential cancer risks for family members.   Bryce Sullivan was diagnosed with prostate cancer at age 39 (Gleason: 7).  CANCER HISTORY:  Oncology History  Prostate cancer (HCC)  12/21/2022 Cancer Staging   Staging form: Prostate, AJCC 8th Edition - Clinical stage from 12/21/2022: Stage IIB (cT2a, cN0, cM0, PSA: 10.1, Grade Group: 2) - Signed by Marcello Fennel, PA-C on 02/21/2023 Histopathologic type: Adenocarcinoma, NOS Stage prefix: Initial diagnosis Prostate specific antigen (PSA) range: 10 to 19 Gleason primary pattern: 3 Gleason secondary pattern: 4 Gleason score: 7 Histologic grading system: 5 grade system Number of biopsy cores examined: 12 Number of biopsy cores positive: 8 Location of positive needle core biopsies: Both sides   02/16/2023 Initial Diagnosis   Prostate cancer (HCC)      Past Medical History:  Diagnosis Date   Elevated PSA    Hypertension     Past Surgical History:  Procedure Laterality Date   PROSTATE BIOPSY      Social History   Socioeconomic History   Marital status: Married    Spouse name: Breccan Lampman   Number of children: 3   Years of education: Not on file   Highest education level: 12th grade  Occupational History   Not on file  Tobacco Use   Smoking status: Never   Smokeless tobacco: Never  Vaping Use   Vaping status: Never Used  Substance and Sexual Activity   Alcohol use: Yes     Comment: occasionally   Drug use: No   Sexual activity: Not on file  Other Topics Concern   Not on file  Social History Narrative   Not on file   Social Determinants of Health   Financial Resource Strain: Not on file  Food Insecurity: No Food Insecurity (02/21/2023)   Hunger Vital Sign    Worried About Running Out of Food in the Last Year: Never true    Ran Out of Food in the Last Year: Never true  Transportation Needs: No Transportation Needs (02/21/2023)   PRAPARE - Administrator, Civil Service (Medical): No    Lack of Transportation (Non-Medical): No  Physical Activity: Not on file  Stress: Not on file  Social Connections: Not on file     FAMILY HISTORY:  We obtained a detailed, 4-generation family history.  Significant diagnoses are listed below: Family History  Problem Relation Age of Onset   Hypertension Mother    Hypertension Father    Hypertension Sister    Diabetes Sister    Hypertension Brother    Diabetes Brother    Prostate cancer Brother    Prostate cancer Brother      Bryce Sullivan is unaware of previous family history of genetic testing for hereditary cancer risks. There is no reported Ashkenazi Jewish ancestry.   GENETIC COUNSELING ASSESSMENT: Bryce Sullivan is a 62 y.o. male with a personal and family history of cancer  which is somewhat suggestive of a hereditary predisposition to cancer. We, therefore, discussed and recommended the following at today's visit.   DISCUSSION: We discussed that 5 - 10% of cancer is hereditary, with most cases of prostate cancer associated with BRCA1/2.  There are other genes that can be associated with hereditary prostate cancer syndromes.  We discussed that testing is beneficial for several reasons including knowing how to follow individuals after completing their treatment, identifying whether potential treatment options would be beneficial, and understanding if other family members could be at risk for cancer and  allowing them to undergo genetic testing.   We reviewed the characteristics, features and inheritance patterns of hereditary cancer syndromes. We also discussed genetic testing, including the appropriate family members to test, the process of testing, insurance coverage and turn-around-time for results. We discussed the implications of a negative, positive, carrier and/or variant of uncertain significant result. We recommended Bryce Sullivan pursue genetic testing for a panel that includes genes associated with prostate cancer.   Based on Bryce Sullivan personal and family history of cancer, he meets medical criteria for genetic testing. Despite that he meets criteria, he may still have an out of pocket cost. We discussed that if his out of pocket cost for testing is over $100, the laboratory will call and confirm whether he wants to proceed with testing.  If the out of pocket cost of testing is less than $100 he will be billed by the genetic testing laboratory.   PLAN: Despite our recommendation, Bryce Sullivan did not wish to pursue genetic testing at today's visit. We understand this decision and remain available to coordinate genetic testing at any time in the future. We, therefore, recommend Bryce Sullivan continue to follow the cancer screening guidelines given by his primary healthcare provider.  Bryce Sullivan questions were answered to his satisfaction today. Our contact information was provided should additional questions or concerns arise. Thank you for the referral and allowing [place] to share in the care of your patient.   Lalla Brothers, MS, [facility] Genetic Counselor [email or web address] (P) [phone]  The patient was seen for a total of <15 minutes in face-to-face genetic counseling.  The patient brought his wife. Drs. Pamelia Hoit and/or Mosetta Putt were available to discuss this case as needed.   _______________________________________________________________________ For Office Staff:  Number of people involved in  session: 2 Was an Intern/ student involved with case: no

## 2023-02-28 NOTE — Progress Notes (Signed)
Patient was seen at the [facility] on 02/21/2023 for his stage T2a adenocarcinoma of the prostate with a Gleason score of 3+4 and a PSA of 10.1.  Patient has decided to proceed with 5.5 weeks of external radiation.  RN notified MD's.  RN educated patient's wife on next steps. No additional needs at this time.  Plan of care in progress.

## 2023-03-07 ENCOUNTER — Other Ambulatory Visit: Payer: Self-pay | Admitting: Urology

## 2023-03-10 NOTE — Progress Notes (Signed)
RN left message for call back.

## 2023-03-16 ENCOUNTER — Telehealth: Payer: Self-pay | Admitting: *Deleted

## 2023-03-16 NOTE — Telephone Encounter (Signed)
CALLED PATIENT TO INFORM OF FID. MARKER AND SPACE OAR ON 04-05-23 AND HIS SIM WILL BE ON 04-10-23 - ARRIVAL TIME- 8:45 AM @ [facility], INFORMED PATIENT TO ARRIVE WITH A FULL BLADDER, SPOKE WITH PATIENT AND HE IS AWARE OF THESE APPTS. AND THE INSTRUCTIONS

## 2023-03-29 ENCOUNTER — Other Ambulatory Visit: Payer: Self-pay

## 2023-03-29 ENCOUNTER — Encounter (HOSPITAL_BASED_OUTPATIENT_CLINIC_OR_DEPARTMENT_OTHER): Payer: Self-pay | Admitting: Urology

## 2023-03-29 NOTE — Progress Notes (Addendum)
Spoke w/ via phone for pre-op interview---pt Lab needs dos----   I stat , ekg    Lab results------ COVID test -----patient states asymptomatic no test needed Arrive at -------700  04-05-2023 NPO after MN NO Solid Food.  Clear liquids from MN until---600 Med rec completed Medications to take morning of surgery -----Amlodipine, Carvedilol Diabetic medication -----n/a Patient instructed no nail polish to be worn day of surgery Patient instructed to bring photo id and insurance card day of surgery Patient aware to have Driver (ride ) / caregiver  [name]   for 24 hours after surgery -  Patient Special Instructions -----fleets enema night before surgery Pre-Op special Instructions -----none Patient verbalized understanding of instructions that were given at this phone interview. Patient denies chest pain, sob, fever, cough at the interview.

## 2023-04-04 NOTE — Anesthesia Preprocedure Evaluation (Signed)
Anesthesia Evaluation  Patient identified by MRN, date of birth, ID band Patient awake    Reviewed: Allergy & Precautions, NPO status , Patient's Chart, lab work & pertinent test results, reviewed documented beta blocker date and time   History of Anesthesia Complications Negative for: history of anesthetic complications  Airway Mallampati: II  TM Distance: >3 FB Neck ROM: Full    Dental  (+) Dental Advisory Given, Chipped,    Pulmonary neg pulmonary ROS   Pulmonary exam normal        Cardiovascular hypertension, Pt. on medications and Pt. on home beta blockers Normal cardiovascular exam     Neuro/Psych negative neurological ROS  negative psych ROS   GI/Hepatic negative GI ROS, Neg liver ROS,,,  Endo/Other  negative endocrine ROS    Renal/GU CRFRenal disease    Prostate cancer     Musculoskeletal negative musculoskeletal ROS (+)    Abdominal   Peds  Hematology negative hematology ROS (+)   Anesthesia Other Findings   Reproductive/Obstetrics                              Anesthesia Physical Anesthesia Plan  ASA: 3  Anesthesia Plan: MAC   Post-op Pain Management: Tylenol PO (pre-op)*   Induction:   PONV Risk Score and Plan: 1 and Propofol infusion and Treatment may vary due to age or medical condition  Airway Management Planned: Natural Airway and Simple Face Mask  Additional Equipment: None  Intra-op Plan:   Post-operative Plan:   Informed Consent: I have reviewed the patients History and Physical, chart, labs and discussed the procedure including the risks, benefits and alternatives for the proposed anesthesia with the patient or authorized representative who has indicated his/her understanding and acceptance.       Plan Discussed with: CRNA and Anesthesiologist  Anesthesia Plan Comments:          Anesthesia Quick Evaluation

## 2023-04-05 ENCOUNTER — Ambulatory Visit (HOSPITAL_BASED_OUTPATIENT_CLINIC_OR_DEPARTMENT_OTHER): Payer: Self-pay | Admitting: Anesthesiology

## 2023-04-05 ENCOUNTER — Ambulatory Visit (HOSPITAL_BASED_OUTPATIENT_CLINIC_OR_DEPARTMENT_OTHER)
Admission: RE | Admit: 2023-04-05 | Discharge: 2023-04-05 | Disposition: A | Discharge status: Home / Self Care | Payer: 59 | Attending: Urology | Admitting: Urology

## 2023-04-05 ENCOUNTER — Ambulatory Visit (HOSPITAL_BASED_OUTPATIENT_CLINIC_OR_DEPARTMENT_OTHER): Payer: 59 | Admitting: Anesthesiology

## 2023-04-05 ENCOUNTER — Encounter (HOSPITAL_BASED_OUTPATIENT_CLINIC_OR_DEPARTMENT_OTHER): Payer: Self-pay | Admitting: Urology

## 2023-04-05 ENCOUNTER — Other Ambulatory Visit: Payer: Self-pay

## 2023-04-05 ENCOUNTER — Telehealth: Payer: Self-pay | Admitting: *Deleted

## 2023-04-05 ENCOUNTER — Encounter (HOSPITAL_BASED_OUTPATIENT_CLINIC_OR_DEPARTMENT_OTHER)
Admission: RE | Disposition: A | Discharge status: Home / Self Care | Payer: Self-pay | Source: Home / Self Care | Attending: Urology

## 2023-04-05 DIAGNOSIS — Z01818 Encounter for other preprocedural examination: Secondary | ICD-10-CM

## 2023-04-05 DIAGNOSIS — I129 Hypertensive chronic kidney disease with stage 1 through stage 4 chronic kidney disease, or unspecified chronic kidney disease: Secondary | ICD-10-CM | POA: Diagnosis not present

## 2023-04-05 DIAGNOSIS — C61 Malignant neoplasm of prostate: Secondary | ICD-10-CM | POA: Insufficient documentation

## 2023-04-05 DIAGNOSIS — N189 Chronic kidney disease, unspecified: Secondary | ICD-10-CM | POA: Insufficient documentation

## 2023-04-05 HISTORY — DX: Malignant neoplasm of prostate: C61

## 2023-04-05 HISTORY — PX: SPACE OAR INSTILLATION: SHX6769

## 2023-04-05 HISTORY — PX: GOLD SEED IMPLANT: SHX6343

## 2023-04-05 LAB — POCT I-STAT, CHEM 8
BUN: 20 mg/dL (ref 8–23)
Calcium, Ion: 1.13 mmol/L — ABNORMAL LOW (ref 1.15–1.40)
Chloride: 105 mmol/L (ref 98–111)
Creatinine, Ser: 1.6 mg/dL — ABNORMAL HIGH (ref 0.61–1.24)
Glucose, Bld: 93 mg/dL (ref 70–99)
HCT: 45 % (ref 39.0–52.0)
Hemoglobin: 15.3 g/dL (ref 13.0–17.0)
Potassium: 3.5 mmol/L (ref 3.5–5.1)
Sodium: 143 mmol/L (ref 135–145)
TCO2: 25 mmol/L (ref 22–32)

## 2023-04-05 SURGERY — INSERTION, GOLD SEEDS
Anesthesia: Monitor Anesthesia Care | Site: Prostate

## 2023-04-05 MED ORDER — OXYCODONE HCL 5 MG/5ML PO SOLN: 5.0000 mg | Freq: Once | ORAL | Status: DC | PRN

## 2023-04-05 MED ORDER — PROPOFOL 500 MG/50ML IV EMUL
INTRAVENOUS | Status: DC | PRN
  Administered 2023-04-05: 180 ug/kg/min via INTRAVENOUS

## 2023-04-05 MED ORDER — LIDOCAINE 2% (20 MG/ML) 5 ML SYRINGE
INTRAMUSCULAR | Status: DC | PRN
  Administered 2023-04-05: 80 mg via INTRAVENOUS

## 2023-04-05 MED ORDER — OXYCODONE HCL 5 MG PO TABS: 5.0000 mg | ORAL_TABLET | Freq: Once | ORAL | Status: DC | PRN

## 2023-04-05 MED ORDER — PROPOFOL 10 MG/ML IV BOLUS
INTRAVENOUS | Status: AC
  Filled 2023-04-05: qty 20

## 2023-04-05 MED ORDER — ACETAMINOPHEN 500 MG PO TABS
1000.0000 mg | ORAL_TABLET | Freq: Once | ORAL | Status: AC
  Administered 2023-04-05: 1000 mg via ORAL

## 2023-04-05 MED ORDER — SODIUM CHLORIDE (PF) 0.9 % IJ SOLN
INTRAMUSCULAR | Status: DC | PRN
  Administered 2023-04-05: 10 mL

## 2023-04-05 MED ORDER — SODIUM CHLORIDE 0.9 % IV SOLN: 12.5000 mg | INTRAVENOUS | Status: DC | PRN

## 2023-04-05 MED ORDER — MIDAZOLAM HCL 2 MG/2ML IJ SOLN
INTRAMUSCULAR | Status: AC
  Filled 2023-04-05: qty 2

## 2023-04-05 MED ORDER — LACTATED RINGERS IV SOLN: INTRAVENOUS | Status: DC

## 2023-04-05 MED ORDER — ONDANSETRON HCL 4 MG/2ML IJ SOLN
INTRAMUSCULAR | Status: DC | PRN
  Administered 2023-04-05: 4 mg via INTRAVENOUS

## 2023-04-05 MED ORDER — FENTANYL CITRATE (PF) 250 MCG/5ML IJ SOLN
INTRAMUSCULAR | Status: DC | PRN
  Administered 2023-04-05: 50 ug via INTRAVENOUS

## 2023-04-05 MED ORDER — LIDOCAINE HCL 2 % IJ SOLN
INTRAMUSCULAR | Status: DC | PRN
Start: 2023-04-05 — End: 2023-04-05
  Administered 2023-04-05: 10 mL

## 2023-04-05 MED ORDER — MIDAZOLAM HCL 2 MG/2ML IJ SOLN
INTRAMUSCULAR | Status: DC | PRN
  Administered 2023-04-05: 2 mg via INTRAVENOUS

## 2023-04-05 MED ORDER — FLEET ENEMA RE ENEM: 1.0000 | ENEMA | Freq: Once | RECTAL | Status: DC

## 2023-04-05 MED ORDER — PROPOFOL 500 MG/50ML IV EMUL
INTRAVENOUS | Status: AC
  Filled 2023-04-05: qty 50

## 2023-04-05 MED ORDER — CEFAZOLIN SODIUM-DEXTROSE 2-4 GM/100ML-% IV SOLN: 2.0000 g | INTRAVENOUS | Status: DC

## 2023-04-05 MED ORDER — LIDOCAINE HCL (PF) 2 % IJ SOLN
INTRAMUSCULAR | Status: AC
  Filled 2023-04-05: qty 5

## 2023-04-05 MED ORDER — FENTANYL CITRATE (PF) 100 MCG/2ML IJ SOLN
INTRAMUSCULAR | Status: AC
  Filled 2023-04-05: qty 2

## 2023-04-05 MED ORDER — PROPOFOL 10 MG/ML IV BOLUS
INTRAVENOUS | Status: DC | PRN
  Administered 2023-04-05: 20 mg via INTRAVENOUS
  Administered 2023-04-05 (×2): 10 mg via INTRAVENOUS

## 2023-04-05 MED ORDER — AMISULPRIDE (ANTIEMETIC) 5 MG/2ML IV SOLN: 10.0000 mg | Freq: Once | INTRAVENOUS | Status: DC | PRN

## 2023-04-05 MED ORDER — FENTANYL CITRATE (PF) 100 MCG/2ML IJ SOLN: 25.0000 ug | INTRAMUSCULAR | Status: DC | PRN

## 2023-04-05 MED ORDER — CEFAZOLIN SODIUM-DEXTROSE 2-4 GM/100ML-% IV SOLN
INTRAVENOUS | Status: AC
  Filled 2023-04-05: qty 100

## 2023-04-05 MED ORDER — ACETAMINOPHEN 500 MG PO TABS
ORAL_TABLET | ORAL | Status: AC
  Filled 2023-04-05: qty 2

## 2023-04-05 SURGICAL SUPPLY — 25 items
BLADE CLIPPER SENSICLIP SURGIC (BLADE) ×1 IMPLANT
CNTNR URN SCR LID CUP LEK RST (MISCELLANEOUS) ×1 IMPLANT
CONT SPEC 4OZ STRL OR WHT (MISCELLANEOUS) ×1
COVER BACK TABLE 60X90IN (DRAPES) ×1 IMPLANT
DRSG TEGADERM 4X4.75 (GAUZE/BANDAGES/DRESSINGS) ×1 IMPLANT
DRSG TEGADERM 8X12 (GAUZE/BANDAGES/DRESSINGS) ×1 IMPLANT
GAUZE SPONGE 4X4 12PLY STRL (GAUZE/BANDAGES/DRESSINGS) ×1 IMPLANT
GLOVE BIO SURGEON STRL SZ7.5 (GLOVE) ×1 IMPLANT
GLOVE SURG ORTHO 8.5 STRL (GLOVE) ×1 IMPLANT
IMPL SPACEOAR VUE SYSTEM (Spacer) ×1 IMPLANT
IMPLANT SPACEOAR VUE SYSTEM (Spacer) ×1 IMPLANT
KIT TURNOVER CYSTO (KITS) ×1 IMPLANT
MARKER GOLD PRELOAD 1.2X3 (Urological Implant) ×1 IMPLANT
MARKER SKIN DUAL TIP RULER LAB (MISCELLANEOUS) ×1 IMPLANT
NDL SPNL 22GX3.5 QUINCKE BK (NEEDLE) IMPLANT
NEEDLE SPNL 22GX3.5 QUINCKE BK (NEEDLE) ×1
SEED GOLD PRELOAD 1.2X3 (Urological Implant) ×1 IMPLANT
SHEATH ULTRASOUND LF (SHEATH) IMPLANT
SHEATH ULTRASOUND LTX NONSTRL (SHEATH) IMPLANT
SLEEVE SCD COMPRESS KNEE MED (STOCKING) ×1 IMPLANT
SURGILUBE 2OZ TUBE FLIPTOP (MISCELLANEOUS) ×1 IMPLANT
SYR 10ML LL (SYRINGE) IMPLANT
SYR CONTROL 10ML LL (SYRINGE) ×1 IMPLANT
TOWEL OR 17X24 6PK STRL BLUE (TOWEL DISPOSABLE) ×1 IMPLANT
UNDERPAD 30X36 HEAVY ABSORB (UNDERPADS AND DIAPERS) ×1 IMPLANT

## 2023-04-05 NOTE — Op Note (Signed)
Preoperative diagnosis: Clinically localized adenocarcinoma of the prostate  Postoperative diagnosis: Clinically localized adenocarcinoma of the prostate  Procedure: 1) Placement of fiducial markers into prostate                    2) Insertion of SpaceOAR hydrogel   Surgeon: Modena Slater, M.D.  Anesthesia: MAC sedation  EBL: Minimal  Complications: None  Indication: Bryce Sullivan is a 62 y.o. gentleman with clinically localized prostate cancer. After discussing management options for treatment, he elected to proceed with radiotherapy. He presents today for the above procedures. The potential risks, complications, alternative options, and expected recovery course have been discussed in detail with the patient and he has provided informed consent to proceed.  Description of procedure: The patient was administered preoperative antibiotics, placed in the dorsal lithotomy position, and prepped and draped in the usual sterile fashion. Next, transrectal ultrasonography was utilized to visualize the prostate.  The perineum was anesthetized with quarter percent Marcaine. Three gold fiducial markers were then placed into the prostate via transperineal needles under ultrasound guidance at the right apex, right base, and left mid gland under direct ultrasound guidance. A site in the midline was then selected on the perineum for placement of an 18 g needle with saline. The needle was advanced above the rectum and below Denonvillier's fascia to the mid gland and confirmed to be in the midline on transverse imaging. One cc of saline was injected confirming appropriate expansion of this space. A total of 5 cc of saline was then injected to open the space further bilaterally. The saline syringe was then removed and the SpaceOAR hydrogel was injected with good distribution bilaterally. He tolerated the procedure well and without complications. He was given a voiding trial prior to discharge from the PACU.

## 2023-04-05 NOTE — Transfer of Care (Signed)
Immediate Anesthesia Transfer of Care Note  Patient: Bryce Sullivan  Procedure(s) Performed: GOLD SEED IMPLANT (Prostate) SPACE OAR INSTILLATION (Prostate)  Patient Location: PACU  Anesthesia Type:MAC  Level of Consciousness: awake, alert , oriented, and patient cooperative  Airway & Oxygen Therapy: Patient Spontanous Breathing  Post-op Assessment: Report given to RN and Post -op Vital signs reviewed and stable  Post vital signs: Reviewed and stable  Last Vitals:  Vitals Value Taken Time  BP 98/69 04/05/23 0950  Temp    Pulse    Resp 16 04/05/23 0951  SpO2    Vitals shown include unfiled device data.  Last Pain:  Vitals:   04/05/23 0737  TempSrc: Oral  PainSc: 0-No pain      Patients Stated Pain Goal: 4 (04/05/23 0737)  Complications: No notable events documented.

## 2023-04-05 NOTE — Telephone Encounter (Signed)
CALLED PATIENT TO REMIND OF SIM APPT. ON  04-10-23 - ARRIVAL TIME- 8:45 AM @ [facility], INFORMED PATIENT TO ARRIVE WITH A FULL BLADDER, SPOKE WITH PATIENT AND HE IS AWARE OF THIS APPT. AND THE INSTRUCTIONS

## 2023-04-05 NOTE — Anesthesia Procedure Notes (Signed)
Procedure Name: MAC Date/Time: 04/05/2023 9:24 AM  Performed by: Bishop Limbo, CRNAPre-anesthesia Checklist: Emergency Drugs available, Suction available, Patient being monitored and Patient identified Oxygen Delivery Method: Simple face mask Placement Confirmation: positive ETCO2 Dental Injury: Teeth and Oropharynx as per pre-operative assessment

## 2023-04-05 NOTE — H&P (Signed)
H&P  Chief Complaint: Prostate cancer  History of Present Illness: 62 year old male with prostate cancer presents for fiducial marker placement and SpaceOAR gel placement.  Past Medical History:  Diagnosis Date   Elevated PSA    Hypertension    Prostate cancer [facility])    Past Surgical History:  Procedure Laterality Date   PROSTATE BIOPSY      Home Medications:  Medications Prior to Admission  Medication Sig Dispense Refill Last Dose   amLODipine (NORVASC) 10 MG tablet Take 1 tablet (10 mg total) by mouth daily. Take 1 tablet by mouth 90 tablet 1 04/05/2023 at 0645   carvedilol (COREG) 25 MG tablet Take 1 tablet (25 mg total) by mouth 2 (two) times daily with a meal. 180 tablet 1 04/05/2023 at 0645   Allergies: No Known Allergies  Family History  Problem Relation Age of Onset   Hypertension Mother    Hypertension Father    Hypertension Sister    Diabetes Sister    Hypertension Brother    Diabetes Brother    Prostate cancer Brother    Prostate cancer Brother    Social History:  reports that he has never smoked. He has never used smokeless tobacco. He reports current alcohol use. He reports that he does not use drugs.  ROS: A complete review of systems was performed.  All systems are negative except for pertinent findings as noted. ROS   Physical Exam:  Vital signs in last 24 hours: Temp:  [97.7 F (36.5 C)] 97.7 F (36.5 C) (10/09 0737) Pulse Rate:  [77] 77 (10/09 0737) Resp:  [16] 16 (10/09 0737) BP: (135)/(89) 135/89 (10/09 0737) SpO2:  [96 %] 96 % (10/09 0737) Weight:  [84.4 kg] 84.4 kg (10/09 0737) General:  Alert and oriented, No acute distress HEENT: Normocephalic, atraumatic Neck: No JVD or lymphadenopathy Cardiovascular: Regular rate and rhythm Lungs: Regular rate and effort Abdomen: Soft, nontender, nondistended, no abdominal masses Back: No CVA tenderness Extremities: No edema Neurologic: Grossly intact  Laboratory Data:  Results for orders placed  or performed during the hospital encounter of 04/05/23 (from the past 24 hour(s))  I-STAT, chem 8     Status: Abnormal   Collection Time: 04/05/23  7:31 AM  Result Value Ref Range   Sodium 143 135 - 145 mmol/L   Potassium 3.5 3.5 - 5.1 mmol/L   Chloride 105 98 - 111 mmol/L   BUN 20 8 - 23 mg/dL   Creatinine, Ser 1.61 (H) 0.61 - 1.24 mg/dL   Glucose, Bld 93 70 - 99 mg/dL   Calcium, Ion 0.96 (L) 1.15 - 1.40 mmol/L   TCO2 25 22 - 32 mmol/L   Hemoglobin 15.3 13.0 - 17.0 g/dL   HCT 04.5 40.9 - 81.1 %   No results found for this or any previous visit (from the past 240 hour(s)). Creatinine: Recent Labs    04/05/23 0731  CREATININE 1.60*    Impression/Assessment:  Prostate cancer  Plan:  Proceed with fiducial marker and SpaceOAR gel placement.  Risks and benefits discussed.  Ray Church, III 04/05/2023, 8:32 AM

## 2023-04-05 NOTE — Anesthesia Postprocedure Evaluation (Signed)
Anesthesia Post Note  Patient: Bryce Sullivan  Procedure(s) Performed: GOLD SEED IMPLANT (Prostate) SPACE OAR INSTILLATION (Prostate)     Patient location during evaluation: PACU Anesthesia Type: MAC Level of consciousness: awake and alert Pain management: pain level controlled Vital Signs Assessment: post-procedure vital signs reviewed and stable Respiratory status: spontaneous breathing, nonlabored ventilation and respiratory function stable Cardiovascular status: stable and blood pressure returned to baseline Anesthetic complications: no   No notable events documented.  Last Vitals:  Vitals:   04/05/23 1015 04/05/23 1040  BP: 123/89 (!) 123/91  Pulse: 68 64  Resp: 13 16  Temp: 36.4 C 36.4 C  SpO2: 94% 98%    Last Pain:  Vitals:   04/05/23 1040  TempSrc:   PainSc: 0-No pain                 Beryle Lathe

## 2023-04-05 NOTE — Progress Notes (Signed)
Patient may return to work 04/06/2023. He should avoid heavy lifting > 20 pounds and strenuous activity for 1 week until 04/12/2023.

## 2023-04-05 NOTE — Discharge Instructions (Addendum)
No acetaminophen/Tylenol until after 1:30pm today if needed for pain.     Post Anesthesia Home Care Instructions  Activity: Get plenty of rest for the remainder of the day. A responsible individual must stay with you for 24 hours following the procedure.  For the next 24 hours, DO NOT: -Drive a car -Paediatric nurse -Drink alcoholic beverages -Take any medication unless instructed by your physician -Make any legal decisions or sign important papers.  Meals: Start with liquid foods such as gelatin or soup. Progress to regular foods as tolerated. Avoid greasy, spicy, heavy foods. If nausea and/or vomiting occur, drink only clear liquids until the nausea and/or vomiting subsides. Call your physician if vomiting continues.  Special Instructions/Symptoms: Your throat may feel dry or sore from the anesthesia or the breathing tube placed in your throat during surgery. If this causes discomfort, gargle with warm salt water. The discomfort should disappear within 24 hours.

## 2023-04-06 ENCOUNTER — Encounter (HOSPITAL_BASED_OUTPATIENT_CLINIC_OR_DEPARTMENT_OTHER): Payer: Self-pay | Admitting: Urology

## 2023-04-09 DIAGNOSIS — Z191 Hormone sensitive malignancy status: Secondary | ICD-10-CM | POA: Diagnosis not present

## 2023-04-09 DIAGNOSIS — C61 Malignant neoplasm of prostate: Secondary | ICD-10-CM | POA: Diagnosis not present

## 2023-04-09 NOTE — Progress Notes (Signed)
Radiation Oncology         [phone] ________________________________  Name: Bryce Sullivan MRN: [id number]  Date: 04/10/2023  DOB: [birth year]  SIMULATION AND TREATMENT PLANNING NOTE    ICD-10-CM   1. Prostate cancer [facility])  C61       DIAGNOSIS:  62 y.o. gentleman with stage T2a adenocarcinoma of the prostate with a Gleason's score of 3+4 and a PSA of 10.1  NARRATIVE:  The patient was brought to the CT Simulation planning suite.  Identity was confirmed.  All relevant records and images related to the planned course of therapy were reviewed.  The patient freely provided informed written consent to proceed with treatment after reviewing the details related to the planned course of therapy. The consent form was witnessed and verified by the simulation staff.  Then, the patient was set-up in a stable reproducible supine position for radiation therapy.  A vacuum lock pillow device was custom fabricated to position his legs in a reproducible immobilized position.  Then, supervised the performance of a urethrogram under sterile conditions to identify the prostatic apex.  CT images were obtained.  Surface markings were placed.  The CT images were loaded into the planning software.  Then the prostate target and avoidance structures including the rectum, bladder, bowel and hips were contoured.  Treatment planning then occurred.  The radiation prescription was entered and confirmed.  A total of one complex treatment devices was fabricated. I have requested : Intensity Modulated Radiotherapy (IMRT) is medically necessary for this case for the following reason:  Rectal sparing.  I have requested daily cone beam CT volumetric image gudiance to track gold fiducial posiitoning along with bladder and rectal filling, this is medically necessary to assure accurate positioning of high dose radiation.  PLAN:  The patient will receive 70 Gy in 28 fractions.  ________________________________  Artist Pais Kathrynn Running, M.D.

## 2023-04-10 ENCOUNTER — Ambulatory Visit
Admission: RE | Admit: 2023-04-10 | Discharge: 2023-04-10 | Disposition: A | Discharge status: Home / Self Care | Payer: 59 | Source: Ambulatory Visit | Attending: Radiation Oncology | Admitting: Radiation Oncology

## 2023-04-10 DIAGNOSIS — C61 Malignant neoplasm of prostate: Secondary | ICD-10-CM | POA: Insufficient documentation

## 2023-04-10 DIAGNOSIS — Z191 Hormone sensitive malignancy status: Secondary | ICD-10-CM | POA: Diagnosis not present

## 2023-04-10 DIAGNOSIS — Z510 Encounter for antineoplastic radiation therapy: Secondary | ICD-10-CM | POA: Insufficient documentation

## 2023-04-11 DIAGNOSIS — Z191 Hormone sensitive malignancy status: Secondary | ICD-10-CM | POA: Diagnosis not present

## 2023-04-11 DIAGNOSIS — Z510 Encounter for antineoplastic radiation therapy: Secondary | ICD-10-CM | POA: Diagnosis not present

## 2023-04-11 DIAGNOSIS — C61 Malignant neoplasm of prostate: Secondary | ICD-10-CM | POA: Diagnosis not present

## 2023-04-26 ENCOUNTER — Ambulatory Visit
Admission: RE | Admit: 2023-04-26 | Discharge: 2023-04-26 | Disposition: A | Discharge status: Home / Self Care | Payer: 59 | Source: Ambulatory Visit | Attending: Radiation Oncology | Admitting: Radiation Oncology

## 2023-04-26 ENCOUNTER — Other Ambulatory Visit: Payer: Self-pay

## 2023-04-26 DIAGNOSIS — Z191 Hormone sensitive malignancy status: Secondary | ICD-10-CM | POA: Diagnosis not present

## 2023-04-26 DIAGNOSIS — Z510 Encounter for antineoplastic radiation therapy: Secondary | ICD-10-CM | POA: Diagnosis not present

## 2023-04-26 DIAGNOSIS — C61 Malignant neoplasm of prostate: Secondary | ICD-10-CM | POA: Diagnosis not present

## 2023-04-26 LAB — RAD ONC ARIA SESSION SUMMARY
Course Elapsed Days: 0
Plan Fractions Treated to Date: 1
Plan Prescribed Dose Per Fraction: 2.5 Gy
Plan Total Fractions Prescribed: 28
Plan Total Prescribed Dose: 70 Gy
Reference Point Dosage Given to Date: 2.5 Gy
Reference Point Session Dosage Given: 2.5 Gy
Session Number: 1

## 2023-04-27 ENCOUNTER — Ambulatory Visit
Admission: RE | Admit: 2023-04-27 | Discharge: 2023-04-27 | Disposition: A | Discharge status: Home / Self Care | Payer: 59 | Source: Ambulatory Visit | Attending: Radiation Oncology | Admitting: Radiation Oncology

## 2023-04-27 ENCOUNTER — Other Ambulatory Visit: Payer: Self-pay

## 2023-04-27 DIAGNOSIS — Z510 Encounter for antineoplastic radiation therapy: Secondary | ICD-10-CM | POA: Diagnosis not present

## 2023-04-27 DIAGNOSIS — Z191 Hormone sensitive malignancy status: Secondary | ICD-10-CM | POA: Diagnosis not present

## 2023-04-27 DIAGNOSIS — C61 Malignant neoplasm of prostate: Secondary | ICD-10-CM | POA: Diagnosis not present

## 2023-04-27 LAB — RAD ONC ARIA SESSION SUMMARY
Course Elapsed Days: 1
Plan Fractions Treated to Date: 2
Plan Prescribed Dose Per Fraction: 2.5 Gy
Plan Total Fractions Prescribed: 28
Plan Total Prescribed Dose: 70 Gy
Reference Point Dosage Given to Date: 5 Gy
Reference Point Session Dosage Given: 2.5 Gy
Session Number: 2

## 2023-04-28 ENCOUNTER — Ambulatory Visit
Admission: RE | Admit: 2023-04-28 | Discharge: 2023-04-28 | Disposition: A | Discharge status: Home / Self Care | Payer: 59 | Source: Ambulatory Visit | Attending: Radiation Oncology | Admitting: Radiation Oncology

## 2023-04-28 ENCOUNTER — Other Ambulatory Visit: Payer: Self-pay

## 2023-04-28 DIAGNOSIS — C61 Malignant neoplasm of prostate: Secondary | ICD-10-CM | POA: Insufficient documentation

## 2023-04-28 DIAGNOSIS — Z510 Encounter for antineoplastic radiation therapy: Secondary | ICD-10-CM | POA: Diagnosis not present

## 2023-04-28 DIAGNOSIS — Z191 Hormone sensitive malignancy status: Secondary | ICD-10-CM | POA: Diagnosis not present

## 2023-04-28 LAB — RAD ONC ARIA SESSION SUMMARY
Course Elapsed Days: 2
Plan Fractions Treated to Date: 3
Plan Prescribed Dose Per Fraction: 2.5 Gy
Plan Total Fractions Prescribed: 28
Plan Total Prescribed Dose: 70 Gy
Reference Point Dosage Given to Date: 7.5 Gy
Reference Point Session Dosage Given: 2.5 Gy
Session Number: 3

## 2023-05-01 ENCOUNTER — Ambulatory Visit
Admission: RE | Admit: 2023-05-01 | Discharge: 2023-05-01 | Disposition: A | Discharge status: Home / Self Care | Payer: 59 | Source: Ambulatory Visit | Attending: Radiation Oncology | Admitting: Radiation Oncology

## 2023-05-01 ENCOUNTER — Other Ambulatory Visit: Payer: Self-pay

## 2023-05-01 DIAGNOSIS — C61 Malignant neoplasm of prostate: Secondary | ICD-10-CM | POA: Diagnosis not present

## 2023-05-01 DIAGNOSIS — Z510 Encounter for antineoplastic radiation therapy: Secondary | ICD-10-CM | POA: Diagnosis not present

## 2023-05-01 DIAGNOSIS — Z191 Hormone sensitive malignancy status: Secondary | ICD-10-CM | POA: Diagnosis not present

## 2023-05-01 LAB — RAD ONC ARIA SESSION SUMMARY
Course Elapsed Days: 5
Plan Fractions Treated to Date: 4
Plan Prescribed Dose Per Fraction: 2.5 Gy
Plan Total Fractions Prescribed: 28
Plan Total Prescribed Dose: 70 Gy
Reference Point Dosage Given to Date: 10 Gy
Reference Point Session Dosage Given: 2.5 Gy
Session Number: 4

## 2023-05-02 ENCOUNTER — Other Ambulatory Visit: Payer: Self-pay

## 2023-05-02 ENCOUNTER — Ambulatory Visit
Admission: RE | Admit: 2023-05-02 | Discharge: 2023-05-02 | Disposition: A | Discharge status: Home / Self Care | Payer: 59 | Source: Ambulatory Visit | Attending: Radiation Oncology | Admitting: Radiation Oncology

## 2023-05-02 DIAGNOSIS — Z191 Hormone sensitive malignancy status: Secondary | ICD-10-CM | POA: Diagnosis not present

## 2023-05-02 DIAGNOSIS — Z510 Encounter for antineoplastic radiation therapy: Secondary | ICD-10-CM | POA: Diagnosis not present

## 2023-05-02 DIAGNOSIS — C61 Malignant neoplasm of prostate: Secondary | ICD-10-CM | POA: Diagnosis not present

## 2023-05-02 LAB — RAD ONC ARIA SESSION SUMMARY
Course Elapsed Days: 6
Plan Fractions Treated to Date: 5
Plan Prescribed Dose Per Fraction: 2.5 Gy
Plan Total Fractions Prescribed: 28
Plan Total Prescribed Dose: 70 Gy
Reference Point Dosage Given to Date: 12.5 Gy
Reference Point Session Dosage Given: 2.5 Gy
Session Number: 5

## 2023-05-03 ENCOUNTER — Other Ambulatory Visit: Payer: Self-pay

## 2023-05-03 ENCOUNTER — Ambulatory Visit
Admission: RE | Admit: 2023-05-03 | Discharge: 2023-05-03 | Disposition: A | Discharge status: Home / Self Care | Payer: 59 | Source: Ambulatory Visit | Attending: Radiation Oncology | Admitting: Radiation Oncology

## 2023-05-03 DIAGNOSIS — C61 Malignant neoplasm of prostate: Secondary | ICD-10-CM | POA: Diagnosis not present

## 2023-05-03 DIAGNOSIS — Z510 Encounter for antineoplastic radiation therapy: Secondary | ICD-10-CM | POA: Diagnosis not present

## 2023-05-03 DIAGNOSIS — Z191 Hormone sensitive malignancy status: Secondary | ICD-10-CM | POA: Diagnosis not present

## 2023-05-03 LAB — RAD ONC ARIA SESSION SUMMARY
Course Elapsed Days: 7
Plan Fractions Treated to Date: 6
Plan Prescribed Dose Per Fraction: 2.5 Gy
Plan Total Fractions Prescribed: 28
Plan Total Prescribed Dose: 70 Gy
Reference Point Dosage Given to Date: 15 Gy
Reference Point Session Dosage Given: 2.5 Gy
Session Number: 6

## 2023-05-04 ENCOUNTER — Ambulatory Visit
Admission: RE | Admit: 2023-05-04 | Discharge: 2023-05-04 | Disposition: A | Discharge status: Home / Self Care | Payer: 59 | Source: Ambulatory Visit | Attending: Radiation Oncology | Admitting: Radiation Oncology

## 2023-05-04 ENCOUNTER — Other Ambulatory Visit: Payer: Self-pay

## 2023-05-04 DIAGNOSIS — Z191 Hormone sensitive malignancy status: Secondary | ICD-10-CM | POA: Diagnosis not present

## 2023-05-04 DIAGNOSIS — Z510 Encounter for antineoplastic radiation therapy: Secondary | ICD-10-CM | POA: Diagnosis not present

## 2023-05-04 DIAGNOSIS — C61 Malignant neoplasm of prostate: Secondary | ICD-10-CM | POA: Diagnosis not present

## 2023-05-04 LAB — RAD ONC ARIA SESSION SUMMARY
Course Elapsed Days: 8
Plan Fractions Treated to Date: 7
Plan Prescribed Dose Per Fraction: 2.5 Gy
Plan Total Fractions Prescribed: 28
Plan Total Prescribed Dose: 70 Gy
Reference Point Dosage Given to Date: 17.5 Gy
Reference Point Session Dosage Given: 2.5 Gy
Session Number: 7

## 2023-05-05 ENCOUNTER — Ambulatory Visit: Payer: 59

## 2023-05-05 ENCOUNTER — Ambulatory Visit
Admission: RE | Admit: 2023-05-05 | Discharge: 2023-05-05 | Disposition: A | Discharge status: Home / Self Care | Payer: 59 | Source: Ambulatory Visit | Attending: Radiation Oncology | Admitting: Radiation Oncology

## 2023-05-05 ENCOUNTER — Other Ambulatory Visit: Payer: Self-pay

## 2023-05-05 DIAGNOSIS — Z510 Encounter for antineoplastic radiation therapy: Secondary | ICD-10-CM | POA: Diagnosis not present

## 2023-05-05 DIAGNOSIS — Z191 Hormone sensitive malignancy status: Secondary | ICD-10-CM | POA: Diagnosis not present

## 2023-05-05 DIAGNOSIS — C61 Malignant neoplasm of prostate: Secondary | ICD-10-CM | POA: Diagnosis not present

## 2023-05-05 LAB — RAD ONC ARIA SESSION SUMMARY
Course Elapsed Days: 9
Plan Fractions Treated to Date: 8
Plan Prescribed Dose Per Fraction: 2.5 Gy
Plan Total Fractions Prescribed: 28
Plan Total Prescribed Dose: 70 Gy
Reference Point Dosage Given to Date: 20 Gy
Reference Point Session Dosage Given: 2.5 Gy
Session Number: 8

## 2023-05-08 ENCOUNTER — Ambulatory Visit: Payer: 59

## 2023-05-08 ENCOUNTER — Other Ambulatory Visit: Payer: Self-pay

## 2023-05-08 ENCOUNTER — Ambulatory Visit
Admission: RE | Admit: 2023-05-08 | Discharge: 2023-05-08 | Disposition: A | Discharge status: Home / Self Care | Payer: 59 | Source: Ambulatory Visit | Attending: Radiation Oncology | Admitting: Radiation Oncology

## 2023-05-08 DIAGNOSIS — Z510 Encounter for antineoplastic radiation therapy: Secondary | ICD-10-CM | POA: Diagnosis not present

## 2023-05-08 DIAGNOSIS — Z191 Hormone sensitive malignancy status: Secondary | ICD-10-CM | POA: Diagnosis not present

## 2023-05-08 DIAGNOSIS — C61 Malignant neoplasm of prostate: Secondary | ICD-10-CM | POA: Diagnosis not present

## 2023-05-08 LAB — RAD ONC ARIA SESSION SUMMARY
Course Elapsed Days: 12
Plan Fractions Treated to Date: 9
Plan Prescribed Dose Per Fraction: 2.5 Gy
Plan Total Fractions Prescribed: 28
Plan Total Prescribed Dose: 70 Gy
Reference Point Dosage Given to Date: 22.5 Gy
Reference Point Session Dosage Given: 2.5 Gy
Session Number: 9

## 2023-05-09 ENCOUNTER — Other Ambulatory Visit: Payer: Self-pay

## 2023-05-09 ENCOUNTER — Ambulatory Visit
Admission: RE | Admit: 2023-05-09 | Discharge: 2023-05-09 | Disposition: A | Discharge status: Home / Self Care | Payer: 59 | Source: Ambulatory Visit | Attending: Radiation Oncology | Admitting: Radiation Oncology

## 2023-05-09 DIAGNOSIS — Z510 Encounter for antineoplastic radiation therapy: Secondary | ICD-10-CM | POA: Diagnosis not present

## 2023-05-09 DIAGNOSIS — C61 Malignant neoplasm of prostate: Secondary | ICD-10-CM | POA: Diagnosis not present

## 2023-05-09 DIAGNOSIS — Z191 Hormone sensitive malignancy status: Secondary | ICD-10-CM | POA: Diagnosis not present

## 2023-05-09 LAB — RAD ONC ARIA SESSION SUMMARY
Course Elapsed Days: 13
Plan Fractions Treated to Date: 10
Plan Prescribed Dose Per Fraction: 2.5 Gy
Plan Total Fractions Prescribed: 28
Plan Total Prescribed Dose: 70 Gy
Reference Point Dosage Given to Date: 25 Gy
Reference Point Session Dosage Given: 2.5 Gy
Session Number: 10

## 2023-05-10 ENCOUNTER — Ambulatory Visit
Admission: RE | Admit: 2023-05-10 | Discharge: 2023-05-10 | Disposition: A | Discharge status: Home / Self Care | Payer: 59 | Source: Ambulatory Visit | Attending: Radiation Oncology | Admitting: Radiation Oncology

## 2023-05-10 ENCOUNTER — Other Ambulatory Visit: Payer: Self-pay

## 2023-05-10 DIAGNOSIS — Z191 Hormone sensitive malignancy status: Secondary | ICD-10-CM | POA: Diagnosis not present

## 2023-05-10 DIAGNOSIS — C61 Malignant neoplasm of prostate: Secondary | ICD-10-CM | POA: Diagnosis not present

## 2023-05-10 DIAGNOSIS — Z510 Encounter for antineoplastic radiation therapy: Secondary | ICD-10-CM | POA: Diagnosis not present

## 2023-05-10 LAB — RAD ONC ARIA SESSION SUMMARY
Course Elapsed Days: 14
Plan Fractions Treated to Date: 11
Plan Prescribed Dose Per Fraction: 2.5 Gy
Plan Total Fractions Prescribed: 28
Plan Total Prescribed Dose: 70 Gy
Reference Point Dosage Given to Date: 27.5 Gy
Reference Point Session Dosage Given: 2.5 Gy
Session Number: 11

## 2023-05-11 ENCOUNTER — Other Ambulatory Visit: Payer: Self-pay

## 2023-05-11 ENCOUNTER — Ambulatory Visit
Admission: RE | Admit: 2023-05-11 | Discharge: 2023-05-11 | Disposition: A | Discharge status: Home / Self Care | Payer: 59 | Source: Ambulatory Visit | Attending: Radiation Oncology | Admitting: Radiation Oncology

## 2023-05-11 DIAGNOSIS — Z510 Encounter for antineoplastic radiation therapy: Secondary | ICD-10-CM | POA: Diagnosis not present

## 2023-05-11 DIAGNOSIS — Z191 Hormone sensitive malignancy status: Secondary | ICD-10-CM | POA: Diagnosis not present

## 2023-05-11 DIAGNOSIS — C61 Malignant neoplasm of prostate: Secondary | ICD-10-CM | POA: Diagnosis not present

## 2023-05-11 LAB — RAD ONC ARIA SESSION SUMMARY
Course Elapsed Days: 15
Plan Fractions Treated to Date: 12
Plan Prescribed Dose Per Fraction: 2.5 Gy
Plan Total Fractions Prescribed: 28
Plan Total Prescribed Dose: 70 Gy
Reference Point Dosage Given to Date: 30 Gy
Reference Point Session Dosage Given: 2.5 Gy
Session Number: 12

## 2023-05-12 ENCOUNTER — Ambulatory Visit
Admission: RE | Admit: 2023-05-12 | Discharge: 2023-05-12 | Disposition: A | Discharge status: Home / Self Care | Payer: 59 | Source: Ambulatory Visit | Attending: Radiation Oncology | Admitting: Radiation Oncology

## 2023-05-12 ENCOUNTER — Other Ambulatory Visit: Payer: Self-pay

## 2023-05-12 DIAGNOSIS — Z191 Hormone sensitive malignancy status: Secondary | ICD-10-CM | POA: Diagnosis not present

## 2023-05-12 DIAGNOSIS — Z510 Encounter for antineoplastic radiation therapy: Secondary | ICD-10-CM | POA: Diagnosis not present

## 2023-05-12 DIAGNOSIS — C61 Malignant neoplasm of prostate: Secondary | ICD-10-CM | POA: Diagnosis not present

## 2023-05-12 LAB — RAD ONC ARIA SESSION SUMMARY
Course Elapsed Days: 16
Plan Fractions Treated to Date: 13
Plan Prescribed Dose Per Fraction: 2.5 Gy
Plan Total Fractions Prescribed: 28
Plan Total Prescribed Dose: 70 Gy
Reference Point Dosage Given to Date: 32.5 Gy
Reference Point Session Dosage Given: 2.5 Gy
Session Number: 13

## 2023-05-15 ENCOUNTER — Ambulatory Visit
Admission: RE | Admit: 2023-05-15 | Discharge: 2023-05-15 | Disposition: A | Discharge status: Home / Self Care | Payer: 59 | Source: Ambulatory Visit | Attending: Radiation Oncology | Admitting: Radiation Oncology

## 2023-05-15 ENCOUNTER — Other Ambulatory Visit: Payer: Self-pay

## 2023-05-15 DIAGNOSIS — Z510 Encounter for antineoplastic radiation therapy: Secondary | ICD-10-CM | POA: Diagnosis not present

## 2023-05-15 DIAGNOSIS — C61 Malignant neoplasm of prostate: Secondary | ICD-10-CM | POA: Diagnosis not present

## 2023-05-15 DIAGNOSIS — Z191 Hormone sensitive malignancy status: Secondary | ICD-10-CM | POA: Diagnosis not present

## 2023-05-15 LAB — RAD ONC ARIA SESSION SUMMARY
Course Elapsed Days: 19
Plan Fractions Treated to Date: 14
Plan Prescribed Dose Per Fraction: 2.5 Gy
Plan Total Fractions Prescribed: 28
Plan Total Prescribed Dose: 70 Gy
Reference Point Dosage Given to Date: 35 Gy
Reference Point Session Dosage Given: 2.5 Gy
Session Number: 14

## 2023-05-16 ENCOUNTER — Other Ambulatory Visit: Payer: Self-pay

## 2023-05-16 ENCOUNTER — Ambulatory Visit
Admission: RE | Admit: 2023-05-16 | Discharge: 2023-05-16 | Disposition: A | Discharge status: Home / Self Care | Payer: 59 | Source: Ambulatory Visit | Attending: Radiation Oncology | Admitting: Radiation Oncology

## 2023-05-16 DIAGNOSIS — Z510 Encounter for antineoplastic radiation therapy: Secondary | ICD-10-CM | POA: Diagnosis not present

## 2023-05-16 DIAGNOSIS — C61 Malignant neoplasm of prostate: Secondary | ICD-10-CM | POA: Diagnosis not present

## 2023-05-16 DIAGNOSIS — Z191 Hormone sensitive malignancy status: Secondary | ICD-10-CM | POA: Diagnosis not present

## 2023-05-16 LAB — RAD ONC ARIA SESSION SUMMARY
Course Elapsed Days: 20
Plan Fractions Treated to Date: 15
Plan Prescribed Dose Per Fraction: 2.5 Gy
Plan Total Fractions Prescribed: 28
Plan Total Prescribed Dose: 70 Gy
Reference Point Dosage Given to Date: 37.5 Gy
Reference Point Session Dosage Given: 2.5 Gy
Session Number: 15

## 2023-05-17 ENCOUNTER — Other Ambulatory Visit: Payer: Self-pay

## 2023-05-17 ENCOUNTER — Ambulatory Visit
Admission: RE | Admit: 2023-05-17 | Discharge: 2023-05-17 | Disposition: A | Discharge status: Home / Self Care | Payer: 59 | Source: Ambulatory Visit | Attending: Radiation Oncology | Admitting: Radiation Oncology

## 2023-05-17 DIAGNOSIS — Z191 Hormone sensitive malignancy status: Secondary | ICD-10-CM | POA: Diagnosis not present

## 2023-05-17 DIAGNOSIS — C61 Malignant neoplasm of prostate: Secondary | ICD-10-CM | POA: Diagnosis not present

## 2023-05-17 DIAGNOSIS — Z510 Encounter for antineoplastic radiation therapy: Secondary | ICD-10-CM | POA: Diagnosis not present

## 2023-05-17 LAB — RAD ONC ARIA SESSION SUMMARY
Course Elapsed Days: 21
Plan Fractions Treated to Date: 16
Plan Prescribed Dose Per Fraction: 2.5 Gy
Plan Total Fractions Prescribed: 28
Plan Total Prescribed Dose: 70 Gy
Reference Point Dosage Given to Date: 40 Gy
Reference Point Session Dosage Given: 2.5 Gy
Session Number: 16

## 2023-05-18 ENCOUNTER — Ambulatory Visit
Admission: RE | Admit: 2023-05-18 | Discharge: 2023-05-18 | Disposition: A | Discharge status: Home / Self Care | Payer: 59 | Source: Ambulatory Visit | Attending: Radiation Oncology | Admitting: Radiation Oncology

## 2023-05-18 ENCOUNTER — Other Ambulatory Visit: Payer: Self-pay

## 2023-05-18 DIAGNOSIS — Z510 Encounter for antineoplastic radiation therapy: Secondary | ICD-10-CM | POA: Diagnosis not present

## 2023-05-18 DIAGNOSIS — Z191 Hormone sensitive malignancy status: Secondary | ICD-10-CM | POA: Diagnosis not present

## 2023-05-18 DIAGNOSIS — C61 Malignant neoplasm of prostate: Secondary | ICD-10-CM | POA: Diagnosis not present

## 2023-05-18 LAB — RAD ONC ARIA SESSION SUMMARY
Course Elapsed Days: 22
Plan Fractions Treated to Date: 17
Plan Prescribed Dose Per Fraction: 2.5 Gy
Plan Total Fractions Prescribed: 28
Plan Total Prescribed Dose: 70 Gy
Reference Point Dosage Given to Date: 42.5 Gy
Reference Point Session Dosage Given: 2.5 Gy
Session Number: 17

## 2023-05-19 ENCOUNTER — Other Ambulatory Visit: Payer: Self-pay

## 2023-05-19 ENCOUNTER — Ambulatory Visit
Admission: RE | Admit: 2023-05-19 | Discharge: 2023-05-19 | Disposition: A | Discharge status: Home / Self Care | Payer: 59 | Source: Ambulatory Visit | Attending: Radiation Oncology | Admitting: Radiation Oncology

## 2023-05-19 DIAGNOSIS — C61 Malignant neoplasm of prostate: Secondary | ICD-10-CM | POA: Diagnosis not present

## 2023-05-19 DIAGNOSIS — Z510 Encounter for antineoplastic radiation therapy: Secondary | ICD-10-CM | POA: Diagnosis not present

## 2023-05-19 DIAGNOSIS — Z191 Hormone sensitive malignancy status: Secondary | ICD-10-CM | POA: Diagnosis not present

## 2023-05-19 LAB — RAD ONC ARIA SESSION SUMMARY
Course Elapsed Days: 23
Plan Fractions Treated to Date: 18
Plan Prescribed Dose Per Fraction: 2.5 Gy
Plan Total Fractions Prescribed: 28
Plan Total Prescribed Dose: 70 Gy
Reference Point Dosage Given to Date: 45 Gy
Reference Point Session Dosage Given: 2.5 Gy
Session Number: 18

## 2023-05-21 ENCOUNTER — Other Ambulatory Visit: Payer: Self-pay

## 2023-05-21 ENCOUNTER — Ambulatory Visit
Admission: RE | Admit: 2023-05-21 | Discharge: 2023-05-21 | Disposition: A | Discharge status: Home / Self Care | Payer: 59 | Source: Ambulatory Visit | Attending: Radiation Oncology | Admitting: Radiation Oncology

## 2023-05-21 DIAGNOSIS — C61 Malignant neoplasm of prostate: Secondary | ICD-10-CM | POA: Diagnosis not present

## 2023-05-21 DIAGNOSIS — Z191 Hormone sensitive malignancy status: Secondary | ICD-10-CM | POA: Diagnosis not present

## 2023-05-21 DIAGNOSIS — Z510 Encounter for antineoplastic radiation therapy: Secondary | ICD-10-CM | POA: Diagnosis not present

## 2023-05-21 LAB — RAD ONC ARIA SESSION SUMMARY
Course Elapsed Days: 25
Plan Fractions Treated to Date: 19
Plan Prescribed Dose Per Fraction: 2.5 Gy
Plan Total Fractions Prescribed: 28
Plan Total Prescribed Dose: 70 Gy
Reference Point Dosage Given to Date: 47.5 Gy
Reference Point Session Dosage Given: 2.5 Gy
Session Number: 19

## 2023-05-22 ENCOUNTER — Ambulatory Visit
Admission: RE | Admit: 2023-05-22 | Discharge: 2023-05-22 | Disposition: A | Discharge status: Home / Self Care | Payer: 59 | Source: Ambulatory Visit | Attending: Radiation Oncology | Admitting: Radiation Oncology

## 2023-05-22 ENCOUNTER — Other Ambulatory Visit: Payer: Self-pay

## 2023-05-22 DIAGNOSIS — C61 Malignant neoplasm of prostate: Secondary | ICD-10-CM | POA: Diagnosis not present

## 2023-05-22 DIAGNOSIS — Z510 Encounter for antineoplastic radiation therapy: Secondary | ICD-10-CM | POA: Diagnosis not present

## 2023-05-22 DIAGNOSIS — Z191 Hormone sensitive malignancy status: Secondary | ICD-10-CM | POA: Diagnosis not present

## 2023-05-22 LAB — RAD ONC ARIA SESSION SUMMARY
Course Elapsed Days: 26
Plan Fractions Treated to Date: 20
Plan Prescribed Dose Per Fraction: 2.5 Gy
Plan Total Fractions Prescribed: 28
Plan Total Prescribed Dose: 70 Gy
Reference Point Dosage Given to Date: 50 Gy
Reference Point Session Dosage Given: 2.5 Gy
Session Number: 20

## 2023-05-23 ENCOUNTER — Ambulatory Visit
Admission: RE | Admit: 2023-05-23 | Discharge: 2023-05-23 | Disposition: A | Discharge status: Home / Self Care | Payer: 59 | Source: Ambulatory Visit | Attending: Radiation Oncology | Admitting: Radiation Oncology

## 2023-05-23 ENCOUNTER — Other Ambulatory Visit: Payer: Self-pay

## 2023-05-23 DIAGNOSIS — Z510 Encounter for antineoplastic radiation therapy: Secondary | ICD-10-CM | POA: Diagnosis not present

## 2023-05-23 DIAGNOSIS — Z191 Hormone sensitive malignancy status: Secondary | ICD-10-CM | POA: Diagnosis not present

## 2023-05-23 DIAGNOSIS — C61 Malignant neoplasm of prostate: Secondary | ICD-10-CM | POA: Diagnosis not present

## 2023-05-23 LAB — RAD ONC ARIA SESSION SUMMARY
Course Elapsed Days: 27
Plan Fractions Treated to Date: 21
Plan Prescribed Dose Per Fraction: 2.5 Gy
Plan Total Fractions Prescribed: 28
Plan Total Prescribed Dose: 70 Gy
Reference Point Dosage Given to Date: 52.5 Gy
Reference Point Session Dosage Given: 2.5 Gy
Session Number: 21

## 2023-05-24 ENCOUNTER — Ambulatory Visit
Admission: RE | Admit: 2023-05-24 | Discharge: 2023-05-24 | Disposition: A | Discharge status: Home / Self Care | Payer: 59 | Source: Ambulatory Visit | Attending: Radiation Oncology | Admitting: Radiation Oncology

## 2023-05-24 ENCOUNTER — Other Ambulatory Visit: Payer: Self-pay

## 2023-05-24 DIAGNOSIS — Z191 Hormone sensitive malignancy status: Secondary | ICD-10-CM | POA: Diagnosis not present

## 2023-05-24 DIAGNOSIS — C61 Malignant neoplasm of prostate: Secondary | ICD-10-CM | POA: Diagnosis not present

## 2023-05-24 DIAGNOSIS — Z510 Encounter for antineoplastic radiation therapy: Secondary | ICD-10-CM | POA: Diagnosis not present

## 2023-05-24 LAB — RAD ONC ARIA SESSION SUMMARY
Course Elapsed Days: 28
Plan Fractions Treated to Date: 22
Plan Prescribed Dose Per Fraction: 2.5 Gy
Plan Total Fractions Prescribed: 28
Plan Total Prescribed Dose: 70 Gy
Reference Point Dosage Given to Date: 55 Gy
Reference Point Session Dosage Given: 2.5 Gy
Session Number: 22

## 2023-05-29 ENCOUNTER — Ambulatory Visit
Admission: RE | Admit: 2023-05-29 | Discharge: 2023-05-29 | Disposition: A | Discharge status: Home / Self Care | Payer: 59 | Source: Ambulatory Visit | Attending: Radiation Oncology | Admitting: Radiation Oncology

## 2023-05-29 ENCOUNTER — Other Ambulatory Visit: Payer: Self-pay

## 2023-05-29 DIAGNOSIS — Z510 Encounter for antineoplastic radiation therapy: Secondary | ICD-10-CM | POA: Insufficient documentation

## 2023-05-29 DIAGNOSIS — C61 Malignant neoplasm of prostate: Secondary | ICD-10-CM

## 2023-05-29 DIAGNOSIS — Z191 Hormone sensitive malignancy status: Secondary | ICD-10-CM | POA: Diagnosis not present

## 2023-05-29 LAB — RAD ONC ARIA SESSION SUMMARY
Course Elapsed Days: 33
Plan Fractions Treated to Date: 23
Plan Prescribed Dose Per Fraction: 2.5 Gy
Plan Total Fractions Prescribed: 28
Plan Total Prescribed Dose: 70 Gy
Reference Point Dosage Given to Date: 57.5 Gy
Reference Point Session Dosage Given: 2.5 Gy
Session Number: 23

## 2023-05-29 NOTE — Progress Notes (Signed)
RN spoke with patient's wife to follow up while patient is under active treatment.  Pt's wife reports he is doing well with some weight loss.  MD aware.  Referral placed for consult with dietician to be educated on healthy food options and recommendations while he is under treatment.  No additional needs at this time.

## 2023-05-30 ENCOUNTER — Other Ambulatory Visit: Payer: Self-pay

## 2023-05-30 ENCOUNTER — Ambulatory Visit
Admission: RE | Admit: 2023-05-30 | Discharge: 2023-05-30 | Disposition: A | Discharge status: Home / Self Care | Payer: 59 | Source: Ambulatory Visit | Attending: Radiation Oncology | Admitting: Radiation Oncology

## 2023-05-30 DIAGNOSIS — Z191 Hormone sensitive malignancy status: Secondary | ICD-10-CM | POA: Diagnosis not present

## 2023-05-30 DIAGNOSIS — Z510 Encounter for antineoplastic radiation therapy: Secondary | ICD-10-CM | POA: Diagnosis not present

## 2023-05-30 DIAGNOSIS — C61 Malignant neoplasm of prostate: Secondary | ICD-10-CM | POA: Diagnosis not present

## 2023-05-30 LAB — RAD ONC ARIA SESSION SUMMARY
Course Elapsed Days: 34
Plan Fractions Treated to Date: 24
Plan Prescribed Dose Per Fraction: 2.5 Gy
Plan Total Fractions Prescribed: 28
Plan Total Prescribed Dose: 70 Gy
Reference Point Dosage Given to Date: 60 Gy
Reference Point Session Dosage Given: 2.5 Gy
Session Number: 24

## 2023-05-31 ENCOUNTER — Ambulatory Visit
Admission: RE | Admit: 2023-05-31 | Discharge: 2023-05-31 | Disposition: A | Discharge status: Home / Self Care | Payer: 59 | Source: Ambulatory Visit | Attending: Radiation Oncology | Admitting: Radiation Oncology

## 2023-05-31 ENCOUNTER — Other Ambulatory Visit: Payer: Self-pay

## 2023-05-31 DIAGNOSIS — C61 Malignant neoplasm of prostate: Secondary | ICD-10-CM | POA: Diagnosis not present

## 2023-05-31 DIAGNOSIS — Z191 Hormone sensitive malignancy status: Secondary | ICD-10-CM | POA: Diagnosis not present

## 2023-05-31 DIAGNOSIS — Z510 Encounter for antineoplastic radiation therapy: Secondary | ICD-10-CM | POA: Diagnosis not present

## 2023-05-31 LAB — RAD ONC ARIA SESSION SUMMARY
Course Elapsed Days: 35
Plan Fractions Treated to Date: 25
Plan Prescribed Dose Per Fraction: 2.5 Gy
Plan Total Fractions Prescribed: 28
Plan Total Prescribed Dose: 70 Gy
Reference Point Dosage Given to Date: 62.5 Gy
Reference Point Session Dosage Given: 2.5 Gy
Session Number: 25

## 2023-06-01 ENCOUNTER — Other Ambulatory Visit: Payer: Self-pay

## 2023-06-01 ENCOUNTER — Ambulatory Visit
Admission: RE | Admit: 2023-06-01 | Discharge: 2023-06-01 | Disposition: A | Discharge status: Home / Self Care | Payer: 59 | Source: Ambulatory Visit | Attending: Radiation Oncology | Admitting: Radiation Oncology

## 2023-06-01 DIAGNOSIS — C61 Malignant neoplasm of prostate: Secondary | ICD-10-CM | POA: Diagnosis not present

## 2023-06-01 DIAGNOSIS — Z191 Hormone sensitive malignancy status: Secondary | ICD-10-CM | POA: Diagnosis not present

## 2023-06-01 DIAGNOSIS — Z510 Encounter for antineoplastic radiation therapy: Secondary | ICD-10-CM | POA: Diagnosis not present

## 2023-06-01 LAB — RAD ONC ARIA SESSION SUMMARY
Course Elapsed Days: 36
Plan Fractions Treated to Date: 26
Plan Prescribed Dose Per Fraction: 2.5 Gy
Plan Total Fractions Prescribed: 28
Plan Total Prescribed Dose: 70 Gy
Reference Point Dosage Given to Date: 65 Gy
Reference Point Session Dosage Given: 2.5 Gy
Session Number: 26

## 2023-06-02 ENCOUNTER — Other Ambulatory Visit: Payer: Self-pay

## 2023-06-02 ENCOUNTER — Ambulatory Visit
Admission: RE | Admit: 2023-06-02 | Discharge: 2023-06-02 | Disposition: A | Discharge status: Home / Self Care | Payer: 59 | Source: Ambulatory Visit | Attending: Radiation Oncology | Admitting: Radiation Oncology

## 2023-06-02 DIAGNOSIS — C61 Malignant neoplasm of prostate: Secondary | ICD-10-CM | POA: Diagnosis not present

## 2023-06-02 DIAGNOSIS — Z191 Hormone sensitive malignancy status: Secondary | ICD-10-CM | POA: Diagnosis not present

## 2023-06-02 DIAGNOSIS — Z510 Encounter for antineoplastic radiation therapy: Secondary | ICD-10-CM | POA: Diagnosis not present

## 2023-06-02 LAB — RAD ONC ARIA SESSION SUMMARY
Course Elapsed Days: 37
Plan Fractions Treated to Date: 27
Plan Prescribed Dose Per Fraction: 2.5 Gy
Plan Total Fractions Prescribed: 28
Plan Total Prescribed Dose: 70 Gy
Reference Point Dosage Given to Date: 67.5 Gy
Reference Point Session Dosage Given: 2.5 Gy
Session Number: 27

## 2023-06-05 ENCOUNTER — Ambulatory Visit: Payer: 59

## 2023-06-05 ENCOUNTER — Other Ambulatory Visit: Payer: Self-pay

## 2023-06-05 ENCOUNTER — Ambulatory Visit
Admission: RE | Admit: 2023-06-05 | Discharge: 2023-06-05 | Disposition: A | Discharge status: Home / Self Care | Payer: 59 | Source: Ambulatory Visit | Attending: Radiation Oncology | Admitting: Radiation Oncology

## 2023-06-05 DIAGNOSIS — C61 Malignant neoplasm of prostate: Secondary | ICD-10-CM | POA: Diagnosis not present

## 2023-06-05 DIAGNOSIS — Z510 Encounter for antineoplastic radiation therapy: Secondary | ICD-10-CM | POA: Diagnosis not present

## 2023-06-05 DIAGNOSIS — Z191 Hormone sensitive malignancy status: Secondary | ICD-10-CM | POA: Diagnosis not present

## 2023-06-05 LAB — RAD ONC ARIA SESSION SUMMARY
Course Elapsed Days: 40
Plan Fractions Treated to Date: 28
Plan Prescribed Dose Per Fraction: 2.5 Gy
Plan Total Fractions Prescribed: 28
Plan Total Prescribed Dose: 70 Gy
Reference Point Dosage Given to Date: 70 Gy
Reference Point Session Dosage Given: 2.5 Gy
Session Number: 28

## 2023-06-06 ENCOUNTER — Ambulatory Visit: Payer: 59

## 2023-06-06 NOTE — Radiation Completion Notes (Signed)
Patient Name: Bryce Sullivan, Bryce Sullivan MRN: [id number] Date of Birth: 1960-11-16 Referring Physician: Kasandra Knudsen, M.D. Date of Service: 2023-06-06 Radiation Oncologist: Margaretmary Bayley, M.D. [facility]                             RADIATION ONCOLOGY END OF TREATMENT NOTE     Diagnosis: C61 Malignant neoplasm of prostate Staging on 2022-12-21: Prostate cancer (HCC) T=cT2a, N=cN0, M=cM0 Intent: Curative     ==========DELIVERED PLANS==========  First Treatment Date: 2023-04-26 Last Treatment Date: 2023-06-05   Plan Name: Prostate Site: Prostate Technique: IMRT Mode: Photon Dose Per Fraction: 2.5 Gy Prescribed Dose (Delivered / Prescribed): 70 Gy / 70 Gy Prescribed Fxs (Delivered / Prescribed): 28 / 28     ==========ON TREATMENT VISIT DATES========== 2023-04-28, 2023-05-04, 2023-05-12, 2023-05-19, 2023-05-29, 2023-06-02     ==========UPCOMING VISITS========== 06/22/2023 [facility] ONCOLOGY NUT 45 Noreene Larsson, [place]  06/19/2023 [facility] OFFICE VISIT Marcine Matar, MD        ==========APPENDIX - ON TREATMENT VISIT NOTES==========   See weekly On Treatment Notes in Epic for details in the Media tab (listed as Progress notes on the On Treatment Visit Dates listed above).

## 2023-06-19 ENCOUNTER — Encounter: Payer: Self-pay | Admitting: Internal Medicine

## 2023-06-19 ENCOUNTER — Ambulatory Visit: Payer: 59 | Attending: Internal Medicine | Admitting: Internal Medicine

## 2023-06-19 VITALS — BP 115/71 | HR 80 | Ht 71.0 in | Wt 180.0 lb

## 2023-06-19 DIAGNOSIS — Z28310 Unvaccinated for covid-19: Secondary | ICD-10-CM

## 2023-06-19 DIAGNOSIS — R634 Abnormal weight loss: Secondary | ICD-10-CM | POA: Diagnosis not present

## 2023-06-19 DIAGNOSIS — C61 Malignant neoplasm of prostate: Secondary | ICD-10-CM | POA: Diagnosis not present

## 2023-06-19 DIAGNOSIS — N1831 Chronic kidney disease, stage 3a: Secondary | ICD-10-CM

## 2023-06-19 DIAGNOSIS — Z1211 Encounter for screening for malignant neoplasm of colon: Secondary | ICD-10-CM

## 2023-06-19 DIAGNOSIS — I10 Essential (primary) hypertension: Secondary | ICD-10-CM | POA: Diagnosis not present

## 2023-06-19 DIAGNOSIS — Z2821 Immunization not carried out because of patient refusal: Secondary | ICD-10-CM

## 2023-06-19 MED ORDER — CARVEDILOL 25 MG PO TABS: 25.0000 mg | ORAL_TABLET | Freq: Two times a day (BID) | ORAL | 1 refills | Status: DC

## 2023-06-19 MED ORDER — AMLODIPINE BESYLATE 10 MG PO TABS: 10.0000 mg | ORAL_TABLET | Freq: Every day | ORAL | 1 refills | Status: DC

## 2023-06-19 NOTE — Progress Notes (Signed)
Patient ID: Bryce Sullivan, male    DOB: May 08, 1961  MRN: [id number]  CC: Hypertension (HTN f/u. /No questions / concerns/No to flu vax.)   Subjective: Bryce Sullivan is a 62 y.o. male who presents for chronic ds management.  Wife is with him. His concerns today include:  Pt with hx of HTN, epistaxis, HL, CKD 3, prostate CA, PreDM   Prostate CA:  since last visit, he completed XRT.  Passing urine okay.  No blood in urine. I note he is down 9 lbs since last visit 4 mths ago.  Reports that his appetite was decreased during the time he was receiving radiation treatment for prostate cancer.  Appetite is better now.  Denies any palpitations or feeling of being hot all the time.  No falls  HYPERTENSION/CKD 3 : Reports compliance with Norvasc 10 mg daily and Coreg 25 mg BID No CP/SOB/LE edema Had appt with nephrology but had to cancel due to XRT.  His GFR has been improving.  Last GFR was 51.  HM: declines flu shot and COVID.  Due for cologuard test.  No fhx of colon CA Patient Active Problem List   Diagnosis Date Noted   Prostate cancer (HCC) 02/16/2023   Stage 3b chronic kidney disease (HCC) 03/17/2020   Pure hypercholesterolemia 03/17/2020   Hyperglycemia 03/17/2020   Influenza vaccination declined 03/17/2020   COVID-19 vaccine series declined 03/17/2020   Essential hypertension 02/27/2020   Epistaxis 02/27/2020     Current Outpatient Medications on File Prior to Visit  Medication Sig Dispense Refill   [DISCONTINUED] metoprolol succinate (TOPROL-XL) 100 MG 24 hr tablet Take 1 tablet (100 mg total) by mouth at bedtime. 30 tablet 0   [DISCONTINUED] NIFEdipine (PROCARDIA-XL/ADALAT CC) 60 MG 24 hr tablet Take 1 tablet (60 mg total) by mouth daily. 30 tablet 0   No current facility-administered medications on file prior to visit.    No Known Allergies  Social History   Socioeconomic History   Marital status: Married    Spouse name: Porfirio Zuloaga   Number of children: 3   Years of  education: Not on file   Highest education level: 12th grade  Occupational History   Not on file  Tobacco Use   Smoking status: Never   Smokeless tobacco: Never  Vaping Use   Vaping status: Never Used  Substance and Sexual Activity   Alcohol use: Yes    Comment: occasionally   Drug use: No   Sexual activity: Not on file  Other Topics Concern   Not on file  Social History Narrative   Not on file   Social Drivers of Health   Financial Resource Strain: Low Risk  (06/19/2023)   Overall Financial Resource Strain (CARDIA)    Difficulty of Paying Living Expenses: Not hard at all  Food Insecurity: No Food Insecurity (06/19/2023)   Hunger Vital Sign    Worried About Running Out of Food in the Last Year: Never true    Ran Out of Food in the Last Year: Never true  Transportation Needs: No Transportation Needs (06/19/2023)   PRAPARE - Administrator, Civil Service (Medical): No    Lack of Transportation (Non-Medical): No  Physical Activity: Sufficiently Active (06/19/2023)   Exercise Vital Sign    Days of Exercise per Week: 6 days    Minutes of Exercise per Session: 60 min  Stress: No Stress Concern Present (06/19/2023)   [employer] of Occupational Health - Occupational Stress Questionnaire  Feeling of Stress : Not at all  Social Connections: Moderately Isolated (06/19/2023)   Social Connection and Isolation Panel [NHANES]    Frequency of Communication with Friends and Family: More than three times a week    Frequency of Social Gatherings with Friends and Family: Three times a week    Attends Religious Services: Never    Active Member of Clubs or Organizations: No    Attends Banker Meetings: Never    Marital Status: Married  Catering manager Violence: Not At Risk (06/19/2023)   Humiliation, Afraid, Rape, and Kick questionnaire    Fear of Current or Ex-Partner: No    Emotionally Abused: No    Physically Abused: No    Sexually Abused: No     Family History  Problem Relation Age of Onset   Hypertension Mother    Hypertension Father    Hypertension Sister    Diabetes Sister    Hypertension Brother    Diabetes Brother    Prostate cancer Brother    Prostate cancer Brother     Past Surgical History:  Procedure Laterality Date   GOLD SEED IMPLANT N/A 04/05/2023   Procedure: GOLD SEED IMPLANT;  Surgeon: Crista Elliot, MD;  Location: [facility];  Service: Urology;  Laterality: N/A;  30 MINUTES   PROSTATE BIOPSY     SPACE OAR INSTILLATION N/A 04/05/2023   Procedure: SPACE OAR INSTILLATION;  Surgeon: Crista Elliot, MD;  Location: [facility];  Service: Urology;  Laterality: N/A;    ROS: Review of Systems Negative except as stated above  PHYSICAL EXAM: BP 115/71 (BP Location: Left Arm, Patient Position: Sitting, Cuff Size: Normal)   Pulse 80   Ht 5\' 11"  (1.803 m)   Wt 180 lb (81.6 kg)   SpO2 99%   BMI 25.10 kg/m   Wt Readings from Last 3 Encounters:  06/19/23 180 lb (81.6 kg)  04/05/23 186 lb (84.4 kg)  02/21/23 189 lb 9.6 oz (86 kg)    Physical Exam   General appearance - alert, well appearing, older African-American male and in no distress Mental status - normal mood, behavior, speech, dress, motor activity, and thought processes Neck - supple, no significant adenopathy.  No thyroid enlargement or thyroid nodules appreciated on exam. Chest - clear to auscultation, no wheezes, rales or rhonchi, symmetric air entry Heart - normal rate, regular rhythm, normal S1, S2, no murmurs, rubs, clicks or gallops Extremities - peripheral pulses normal, no pedal edema, no clubbing or cyanosis     Latest Ref Rng & Units 04/05/2023    7:31 AM 02/16/2023    9:12 AM 06/24/2022    9:26 AM  CMP  Glucose 70 - 99 mg/dL 93  96  97   BUN 8 - 23 mg/dL 20  17  17    Creatinine 0.61 - 1.24 mg/dL 8.29  5.62  1.30   Sodium 135 - 145 mmol/L 143  142  139   Potassium 3.5 - 5.1 mmol/L 3.5  3.6   3.9   Chloride 98 - 111 mmol/L 105  103  99   CO2 20 - 29 mmol/L  26  25   Calcium 8.6 - 10.2 mg/dL  8.7  9.0   Total Protein 6.0 - 8.5 g/dL   7.4   Total Bilirubin 0.0 - 1.2 mg/dL   0.7   Alkaline Phos 44 - 121 IU/L   119   AST 0 - 40 IU/L  31   ALT 0 - 44 IU/L   20    Lipid Panel     Component Value Date/Time   CHOL 179 06/24/2022 0926   TRIG 86 06/24/2022 0926   HDL 78 06/24/2022 0926   CHOLHDL 2.3 06/24/2022 0926   LDLCALC 85 06/24/2022 0926    CBC    Component Value Date/Time   WBC 4.2 06/24/2022 0926   WBC 11.2 (H) 09/06/2016 0513   RBC 5.22 06/24/2022 0926   RBC 5.38 09/06/2016 0513   HGB 15.3 04/05/2023 0731   HGB 14.9 06/24/2022 0926   HCT 45.0 04/05/2023 0731   HCT 45.0 06/24/2022 0926   PLT 201 06/24/2022 0926   MCV 86 06/24/2022 0926   MCH 28.5 06/24/2022 0926   MCH 29.2 09/06/2016 0513   MCHC 33.1 06/24/2022 0926   MCHC 34.0 09/06/2016 0513   RDW 12.5 06/24/2022 0926   LYMPHSABS 1.1 03/27/2010 1251   MONOABS 0.2 03/27/2010 1251   EOSABS 0.0 03/27/2010 1251   BASOSABS 0.0 03/27/2010 1251    ASSESSMENT AND PLAN: 1. Essential hypertension (Primary) At goal.  Continue carvedilol and amlodipine. - carvedilol (COREG) 25 MG tablet; Take 1 tablet (25 mg total) by mouth 2 (two) times daily with a meal.  Dispense: 180 tablet; Refill: 1 - amLODipine (NORVASC) 10 MG tablet; Take 1 tablet (10 mg total) by mouth daily. Take 1 tablet by mouth  Dispense: 90 tablet; Refill: 1 - CBC - Comprehensive metabolic panel - Lipid panel  2. Stage 3a chronic kidney disease (HCC) GFR has improved.  We will continue to monitor.  Avoid NSAIDs.  3. Prostate cancer (HCC) Just completed XRT earlier this month.  Has follow-up with his urologist Dr. Kathrynn Running.  4. Weight loss, unintentional Patient reports poor appetite during the time he was receiving radiation.  Advised to purchase some boost or Ensure supplement over-the-counter to drink to supplement his meals.  We will get  him up-to-date with age-appropriate cancer screenings and check thyroid level. - TSH+T4F+T3Free  5. Screening for colon cancer Patient prefers Cologuard test rather than colonoscopy.  No family history of colon cancer. - Cologuard  6. Influenza vaccination declined Recommended.  Patient declined.  7. COVID-19 vaccine series declined Recommended.  Patient declined.    Patient was given the opportunity to ask questions.  Patient verbalized understanding of the plan and was able to repeat key elements of the plan.   This documentation was completed using Paediatric nurse.  Any transcriptional errors are unintentional.  Orders Placed This Encounter  Procedures   Cologuard   CBC   Comprehensive metabolic panel   Lipid panel   TSH+T4F+T3Free     Requested Prescriptions   Signed Prescriptions Disp Refills   carvedilol (COREG) 25 MG tablet 180 tablet 1    Sig: Take 1 tablet (25 mg total) by mouth 2 (two) times daily with a meal.   amLODipine (NORVASC) 10 MG tablet 90 tablet 1    Sig: Take 1 tablet (10 mg total) by mouth daily. Take 1 tablet by mouth    Return in about 4 months (around 10/18/2023).  Jonah Blue, MD, FACP

## 2023-06-20 LAB — COMPREHENSIVE METABOLIC PANEL
ALT: 26 [IU]/L (ref 0–44)
AST: 36 [IU]/L (ref 0–40)
Albumin: 3.9 g/dL (ref 3.9–4.9)
Alkaline Phosphatase: 88 [IU]/L (ref 44–121)
BUN/Creatinine Ratio: 15 (ref 10–24)
BUN: 30 mg/dL — ABNORMAL HIGH (ref 8–27)
Bilirubin Total: 0.9 mg/dL (ref 0.0–1.2)
CO2: 24 mmol/L (ref 20–29)
Calcium: 8.8 mg/dL (ref 8.6–10.2)
Chloride: 98 mmol/L (ref 96–106)
Creatinine, Ser: 2.02 mg/dL — ABNORMAL HIGH (ref 0.76–1.27)
Globulin, Total: 2.3 g/dL (ref 1.5–4.5)
Glucose: 93 mg/dL (ref 70–99)
Potassium: 4.3 mmol/L (ref 3.5–5.2)
Sodium: 139 mmol/L (ref 134–144)
Total Protein: 6.2 g/dL (ref 6.0–8.5)
eGFR: 37 mL/min/{1.73_m2} — ABNORMAL LOW (ref >59)

## 2023-06-20 LAB — LIPID PANEL
Chol/HDL Ratio: 2 {ratio} (ref 0.0–5.0)
Cholesterol, Total: 158 mg/dL (ref 100–199)
HDL: 81 mg/dL (ref >39)
LDL Chol Calc (NIH): 64 mg/dL (ref 0–99)
Triglycerides: 67 mg/dL (ref 0–149)
VLDL Cholesterol Cal: 13 mg/dL (ref 5–40)

## 2023-06-20 LAB — CBC
Hematocrit: 46.2 % (ref 37.5–51.0)
Hemoglobin: 15.1 g/dL (ref 13.0–17.7)
MCH: 29.4 pg (ref 26.6–33.0)
MCHC: 32.7 g/dL (ref 31.5–35.7)
MCV: 90 fL (ref 79–97)
Platelets: 166 10*3/uL (ref 150–450)
RBC: 5.14 x10E6/uL (ref 4.14–5.80)
RDW: 12.5 % (ref 11.6–15.4)
WBC: 3.5 10*3/uL (ref 3.4–10.8)

## 2023-06-20 LAB — TSH+T4F+T3FREE
Free T4: 1.3 ng/dL (ref 0.82–1.77)
T3, Free: 1.9 pg/mL — ABNORMAL LOW (ref 2.0–4.4)
TSH: 1.38 u[IU]/mL (ref 0.450–4.500)

## 2023-06-22 ENCOUNTER — Inpatient Hospital Stay: Payer: 59 | Attending: Dietician | Admitting: Dietician

## 2023-06-22 NOTE — Progress Notes (Signed)
Patient did not show for scheduled nutrition appointment.

## 2023-06-29 ENCOUNTER — Telehealth: Payer: Self-pay | Admitting: Radiation Oncology

## 2023-06-29 NOTE — Telephone Encounter (Signed)
Scheduled appointment per scheduling message. Patient is aware of the made appointments.  

## 2023-07-03 DIAGNOSIS — Z1211 Encounter for screening for malignant neoplasm of colon: Secondary | ICD-10-CM | POA: Diagnosis not present

## 2023-07-04 ENCOUNTER — Ambulatory Visit
Admission: RE | Admit: 2023-07-04 | Discharge: 2023-07-04 | Disposition: A | Discharge status: Home / Self Care | Payer: 59 | Source: Ambulatory Visit | Attending: Radiation Oncology | Admitting: Radiation Oncology

## 2023-07-04 DIAGNOSIS — C61 Malignant neoplasm of prostate: Secondary | ICD-10-CM | POA: Insufficient documentation

## 2023-07-04 DIAGNOSIS — Z510 Encounter for antineoplastic radiation therapy: Secondary | ICD-10-CM | POA: Insufficient documentation

## 2023-07-04 NOTE — Progress Notes (Addendum)
 Patient was presented to the [facility] on 02/21/2023 for his stage T2a adenocarcinoma of the prostate with a Gleason's score of 3+4 and a PSA of 10.1.  Patient proceed with treatment recommendations of 5.5 weeks of daily radiation and had his final radiation treatment on 12/9.   Patient is scheduled for a post treatment nurse call on 07/04/23 and has his first post treatment appointment at 9am on 3/31 with Dr. Elisabeth at [facility].    RN coordinated for urology follow up, contacted patient's wife, Stephane, however she was unavailable.  RN will contact at a later time to review follow up appointment and post treatment education.

## 2023-07-04 NOTE — Progress Notes (Signed)
  Radiation Oncology         [phone] ________________________________  Name: Bryce Sullivan MRN: [id number]  Date of Service: 07/04/2023  DOB: 15-Jul-1960  Post Treatment Telephone Note  Diagnosis:  C61 Malignant neoplasm of prostate (as documented in provider EOT note)   Pre Treatment IPSS Score: 8 (as documented in the provider consult note)  The patient was not available for call today. Voicemail unavailable.  Patient has a scheduled follow up visit with his urologist, Dr. Elisabeth, on 09/2023 for ongoing surveillance. He was counseled that PSA levels will be drawn in the urology office, and was reassured that additional time is expected to improve bowel and bladder symptoms. He was encouraged to call back with concerns or questions regarding radiation.    Rosaline Minerva, LPN

## 2023-07-06 DIAGNOSIS — N1832 Chronic kidney disease, stage 3b: Secondary | ICD-10-CM | POA: Diagnosis not present

## 2023-07-06 DIAGNOSIS — R809 Proteinuria, unspecified: Secondary | ICD-10-CM | POA: Diagnosis not present

## 2023-07-06 DIAGNOSIS — C61 Malignant neoplasm of prostate: Secondary | ICD-10-CM | POA: Diagnosis not present

## 2023-07-06 DIAGNOSIS — I129 Hypertensive chronic kidney disease with stage 1 through stage 4 chronic kidney disease, or unspecified chronic kidney disease: Secondary | ICD-10-CM | POA: Diagnosis not present

## 2023-07-07 LAB — COLOGUARD: COLOGUARD: NEGATIVE

## 2023-07-10 ENCOUNTER — Inpatient Hospital Stay: Payer: 59 | Attending: Dietician | Admitting: Nutrition

## 2023-07-10 ENCOUNTER — Telehealth: Payer: Self-pay | Admitting: Dietician

## 2023-07-10 ENCOUNTER — Encounter: Payer: Self-pay | Admitting: Nutrition

## 2023-07-10 NOTE — Telephone Encounter (Signed)
Scheduled appointment per scheduling message. Patient is aware of the made appointment. 

## 2023-07-10 NOTE — Progress Notes (Signed)
 Patient did not show up for nutrition appointment.

## 2023-07-11 NOTE — Progress Notes (Signed)
 RN spoke with patient's wife and provided post treatment education with PSA monitoring.  Patient's wife aware of follow up with Dr. Elisabeth on 09/25/23 at 9:00am.  Patient does wish to cancel nutrition appointment at this time due to his appetite increasing and reporting he has gained weight back.  RN will cancel appointment.  No additional needs at this time.

## 2023-07-14 ENCOUNTER — Encounter: Payer: 59 | Admitting: Dietician

## 2023-10-19 ENCOUNTER — Ambulatory Visit: Payer: 59 | Admitting: Internal Medicine

## 2024-03-11 ENCOUNTER — Other Ambulatory Visit: Payer: Self-pay | Admitting: Internal Medicine

## 2024-03-11 DIAGNOSIS — I10 Essential (primary) hypertension: Secondary | ICD-10-CM

## 2024-03-12 NOTE — Telephone Encounter (Signed)
 OFFICE VISIT NEEDED FOR ADDITIONAL REFILLS.  Requested Prescriptions  Pending Prescriptions Disp Refills   amLODipine  (NORVASC ) 10 MG tablet [Pharmacy Med Name: amLODIPine  Besylate 10 MG Oral Tablet] 30 tablet 0    Sig: Take 1 tablet by mouth once daily     Cardiovascular: Calcium Channel Blockers 2 Failed - 03/12/2024  1:16 PM      Failed - Valid encounter within last 6 months    Recent Outpatient Visits           8 months ago Essential hypertension   [facility] [place]. [facility] Barnie NOVAK, MD   1 year ago Essential hypertension   [facility] Of [place]. [facility] Barnie NOVAK, MD   1 year ago Essential hypertension   [facility] Dept Of [place]. [facility] Barnie NOVAK, MD   1 year ago Essential hypertension   [facility] Of [place]. [facility] Tonia Garnette LITTIE, RPH-CPP   1 year ago Essential hypertension   [facility] Of [place]. [facility] Barnie NOVAK, MD              Passed - Last BP in normal range    BP Readings from Last 1 Encounters:  06/19/23 115/71         Passed - Last Heart Rate in normal range    Pulse Readings from Last 1 Encounters:  06/19/23 80

## 2024-03-14 ENCOUNTER — Other Ambulatory Visit: Payer: Self-pay | Admitting: Internal Medicine

## 2024-03-14 DIAGNOSIS — I10 Essential (primary) hypertension: Secondary | ICD-10-CM

## 2024-04-11 ENCOUNTER — Other Ambulatory Visit: Payer: Self-pay | Admitting: Internal Medicine

## 2024-04-11 DIAGNOSIS — I10 Essential (primary) hypertension: Secondary | ICD-10-CM

## 2024-04-12 ENCOUNTER — Other Ambulatory Visit: Payer: Self-pay | Admitting: Internal Medicine

## 2024-04-12 DIAGNOSIS — I10 Essential (primary) hypertension: Secondary | ICD-10-CM
